# Patient Record
Sex: Female | Born: 1985 | Race: Black or African American | Hispanic: No | Marital: Single | Smoking: Former smoker
Health system: Southern US, Community
[De-identification: ages and names within clinical notes are randomized; demographics above are authoritative.]

## PROBLEM LIST (undated history)

## (undated) DIAGNOSIS — I2699 Other pulmonary embolism without acute cor pulmonale: Secondary | ICD-10-CM

## (undated) DIAGNOSIS — N390 Urinary tract infection, site not specified: Secondary | ICD-10-CM

## (undated) DIAGNOSIS — D649 Anemia, unspecified: Secondary | ICD-10-CM

## (undated) DIAGNOSIS — I82409 Acute embolism and thrombosis of unspecified deep veins of unspecified lower extremity: Secondary | ICD-10-CM

## (undated) DIAGNOSIS — K219 Gastro-esophageal reflux disease without esophagitis: Secondary | ICD-10-CM

## (undated) HISTORY — DX: Gastro-esophageal reflux disease without esophagitis: K21.9

## (undated) HISTORY — DX: Urinary tract infection, site not specified: N39.0

---

## 2015-03-30 ENCOUNTER — Other Ambulatory Visit (HOSPITAL_COMMUNITY): Payer: Self-pay | Admitting: Family Medicine

## 2015-03-30 DIAGNOSIS — M5416 Radiculopathy, lumbar region: Secondary | ICD-10-CM

## 2015-04-16 ENCOUNTER — Ambulatory Visit (HOSPITAL_COMMUNITY): Payer: Managed Care, Other (non HMO)

## 2015-05-28 ENCOUNTER — Emergency Department (HOSPITAL_COMMUNITY)
Admission: EM | Admit: 2015-05-28 | Discharge: 2015-05-29 | Disposition: A | Discharge status: Home / Self Care | Payer: Managed Care, Other (non HMO) | Attending: Emergency Medicine | Admitting: Emergency Medicine

## 2015-05-28 ENCOUNTER — Encounter (HOSPITAL_COMMUNITY): Payer: Self-pay | Admitting: Emergency Medicine

## 2015-05-28 ENCOUNTER — Emergency Department (HOSPITAL_COMMUNITY): Payer: Managed Care, Other (non HMO)

## 2015-05-28 DIAGNOSIS — R0602 Shortness of breath: Secondary | ICD-10-CM | POA: Insufficient documentation

## 2015-05-28 DIAGNOSIS — R5381 Other malaise: Secondary | ICD-10-CM | POA: Insufficient documentation

## 2015-05-28 DIAGNOSIS — Z87891 Personal history of nicotine dependence: Secondary | ICD-10-CM | POA: Insufficient documentation

## 2015-05-28 DIAGNOSIS — K5900 Constipation, unspecified: Secondary | ICD-10-CM | POA: Insufficient documentation

## 2015-05-28 DIAGNOSIS — Z86711 Personal history of pulmonary embolism: Secondary | ICD-10-CM | POA: Insufficient documentation

## 2015-05-28 DIAGNOSIS — R1310 Dysphagia, unspecified: Secondary | ICD-10-CM | POA: Insufficient documentation

## 2015-05-28 DIAGNOSIS — K219 Gastro-esophageal reflux disease without esophagitis: Secondary | ICD-10-CM | POA: Insufficient documentation

## 2015-05-28 DIAGNOSIS — Z3202 Encounter for pregnancy test, result negative: Secondary | ICD-10-CM | POA: Diagnosis not present

## 2015-05-28 DIAGNOSIS — R079 Chest pain, unspecified: Secondary | ICD-10-CM | POA: Diagnosis present

## 2015-05-28 HISTORY — DX: Other pulmonary embolism without acute cor pulmonale: I26.99

## 2015-05-28 LAB — CBC
HEMATOCRIT: 36.4 % (ref 36.0–46.0)
Hemoglobin: 11.9 g/dL — ABNORMAL LOW (ref 12.0–15.0)
MCH: 29.8 pg (ref 26.0–34.0)
MCHC: 32.7 g/dL (ref 30.0–36.0)
MCV: 91.2 fL (ref 78.0–100.0)
Platelets: 225 10*3/uL (ref 150–400)
RBC: 3.99 MIL/uL (ref 3.87–5.11)
RDW: 13.2 % (ref 11.5–15.5)
WBC: 6.8 10*3/uL (ref 4.0–10.5)

## 2015-05-28 LAB — HEPATIC FUNCTION PANEL
ALBUMIN: 3.9 g/dL (ref 3.5–5.0)
ALK PHOS: 48 U/L (ref 38–126)
ALT: 10 U/L — ABNORMAL LOW (ref 14–54)
AST: 14 U/L — ABNORMAL LOW (ref 15–41)
BILIRUBIN TOTAL: 0.4 mg/dL (ref 0.3–1.2)
Bilirubin, Direct: <0.1 mg/dL — ABNORMAL LOW (ref 0.1–0.5)
TOTAL PROTEIN: 7.2 g/dL (ref 6.5–8.1)

## 2015-05-28 LAB — CBC WITH DIFFERENTIAL/PLATELET
BASOS ABS: 0 10*3/uL (ref 0.0–0.1)
Basophils Relative: 1 %
EOS ABS: 0.1 10*3/uL (ref 0.0–0.7)
EOS PCT: 1 %
HCT: 36.5 % (ref 36.0–46.0)
HEMOGLOBIN: 12.1 g/dL (ref 12.0–15.0)
LYMPHS ABS: 3.7 10*3/uL (ref 0.7–4.0)
Lymphocytes Relative: 55 %
MCH: 30.3 pg (ref 26.0–34.0)
MCHC: 33.2 g/dL (ref 30.0–36.0)
MCV: 91.5 fL (ref 78.0–100.0)
Monocytes Absolute: 0.4 10*3/uL (ref 0.1–1.0)
Monocytes Relative: 7 %
NEUTROS PCT: 36 %
Neutro Abs: 2.3 10*3/uL (ref 1.7–7.7)
PLATELETS: 223 10*3/uL (ref 150–400)
RBC: 3.99 MIL/uL (ref 3.87–5.11)
RDW: 13.1 % (ref 11.5–15.5)
WBC: 6.5 10*3/uL (ref 4.0–10.5)

## 2015-05-28 LAB — BASIC METABOLIC PANEL
Anion gap: 9 (ref 5–15)
BUN: 9 mg/dL (ref 6–20)
CHLORIDE: 107 mmol/L (ref 101–111)
CO2: 23 mmol/L (ref 22–32)
Calcium: 8.8 mg/dL — ABNORMAL LOW (ref 8.9–10.3)
Creatinine, Ser: 0.8 mg/dL (ref 0.44–1.00)
GFR calc Af Amer: >60 mL/min (ref >60)
GFR calc non Af Amer: >60 mL/min (ref >60)
GLUCOSE: 83 mg/dL (ref 65–99)
POTASSIUM: 3.7 mmol/L (ref 3.5–5.1)
Sodium: 139 mmol/L (ref 135–145)

## 2015-05-28 LAB — POC URINE PREG, ED: Preg Test, Ur: NEGATIVE

## 2015-05-28 LAB — I-STAT TROPONIN, ED: Troponin i, poc: 0 ng/mL (ref 0.00–0.08)

## 2015-05-28 LAB — LIPASE, BLOOD: Lipase: 52 U/L — ABNORMAL HIGH (ref 11–51)

## 2015-05-28 MED ORDER — SUCRALFATE 1 G PO TABS
1.0000 g | ORAL_TABLET | Freq: Once | ORAL | Status: AC
  Administered 2015-05-28: 1 g via ORAL
  Filled 2015-05-28: qty 1

## 2015-05-28 MED ORDER — SODIUM CHLORIDE 0.9 % IV SOLN: 1000.0000 mL | Freq: Once | INTRAVENOUS | Status: DC

## 2015-05-28 MED ORDER — SODIUM CHLORIDE 0.9 % IV BOLUS (SEPSIS)
1000.0000 mL | Freq: Once | INTRAVENOUS | Status: AC
Start: 2015-05-28 — End: 2015-05-29
  Administered 2015-05-28: 1000 mL via INTRAVENOUS

## 2015-05-28 MED ORDER — GI COCKTAIL ~~LOC~~
30.0000 mL | Freq: Once | ORAL | Status: AC
  Administered 2015-05-28: 30 mL via ORAL
  Filled 2015-05-28: qty 30

## 2015-05-28 MED ORDER — PANTOPRAZOLE SODIUM 40 MG IV SOLR
40.0000 mg | Freq: Once | INTRAVENOUS | Status: AC
  Administered 2015-05-28: 40 mg via INTRAVENOUS
  Filled 2015-05-28: qty 40

## 2015-05-28 MED ORDER — ONDANSETRON HCL 4 MG/2ML IJ SOLN
4.0000 mg | Freq: Once | INTRAMUSCULAR | Status: AC
  Administered 2015-05-28: 4 mg via INTRAVENOUS
  Filled 2015-05-28: qty 2

## 2015-05-28 MED ORDER — SODIUM CHLORIDE 0.9 % IV SOLN: 1000.0000 mL | INTRAVENOUS | Status: DC

## 2015-05-28 NOTE — ED Notes (Signed)
PA at bedside.

## 2015-05-28 NOTE — ED Provider Notes (Signed)
CSN: [id number]     Arrival date & time 05/28/15  1744 History   First MD Initiated Contact with Patient 05/28/15 2141     Chief Complaint  Patient presents with  . Chest Pain     (Consider location/radiation/quality/duration/timing/severity/associated sxs/prior Treatment) HPI   Catherine Torres is a 30 y.o. 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  Associated symptoms include SOB, nausea, diarrhea (resolved), constipation, malaise, and dysphagia.  Denies cough, vomiting, or urinary symptoms.  She reports decreased PO intake secondary to nausea.  No family hx of sudden cardiac death.  She does not smoke.  Denies illicit drug use.  Denies recent changes in diet or medications.  She does have a PCP, but has not seen them for this problem.     Past Medical History  Diagnosis Date  . Pulmonary emboli (HCC)    History reviewed. No pertinent past surgical history. History reviewed. No pertinent family history. Social History  Substance Use Topics  . Smoking status: Former Games developer  . Smokeless tobacco: None  . Alcohol Use: No   OB History    No data available     Review of Systems  All other systems negative unless otherwise stated in HPI   Allergies  Review of patient's allergies indicates no known allergies.  Home Medications   Prior to Admission medications   Medication Sig Start Date End Date Taking? Authorizing Provider  ibuprofen (ADVIL,MOTRIN) 200 MG tablet Take 200 mg by mouth every 6 (six) hours as needed for moderate pain.   Yes Historical Provider, MD  pantoprazole (PROTONIX) 40 MG tablet Take 1 tablet (40 mg total) by mouth daily. 05/29/15   Cheri Fowler, PA-C  sucralfate (CARAFATE) 1 g tablet Take 1 tablet (1 g total) by mouth 4 (four) times daily. 05/29/15   Yemaya Barnier,  PA-C   BP 120/83 mmHg  Pulse 76  Temp(Src) 98 F (36.7 C) (Oral)  Resp 16  Ht 5\' 3"  (1.6 m)  Wt 81.647 kg  BMI 31.89 kg/m2  SpO2 100%  LMP 05/17/2015 Physical Exam  Constitutional: She is oriented to person, place, and time. She appears well-developed and well-nourished.  HENT:  Head: Normocephalic and atraumatic.  Mouth/Throat: Oropharynx is clear and moist.  Eyes: Conjunctivae are normal. Pupils are equal, round, and reactive to light.  Neck: Normal range of motion. Neck supple.  Cardiovascular: Normal rate, regular rhythm and normal heart sounds.   No murmur heard. Pulmonary/Chest: Effort normal and breath sounds normal. No accessory muscle usage or stridor. No respiratory distress. She has no wheezes. She has no rhonchi. She has no rales.  Abdominal: Soft. Bowel sounds are normal. She exhibits no distension. There is tenderness in the epigastric area. There is no rebound and no guarding.  Musculoskeletal: Normal range of motion.  Lymphadenopathy:    She has no cervical adenopathy.  Neurological: She is alert and oriented to person, place, and time.  Speech clear without dysarthria.  Skin: Skin is warm and dry.  Psychiatric: She has a normal mood and affect. Her behavior is normal.    ED Course  Procedures (including critical care time) Labs Review Labs Reviewed  BASIC METABOLIC PANEL - Abnormal; Notable for the following:    Calcium 8.8 (*)    All other components within normal limits  CBC -  Abnormal; Notable for the following:    Hemoglobin 11.9 (*)    All other components within normal limits  LIPASE, BLOOD - Abnormal; Notable for the following:    Lipase 52 (*)    All other components within normal limits  HEPATIC FUNCTION PANEL - Abnormal; Notable for the following:    AST 14 (*)    ALT 10 (*)    Bilirubin, Direct <0.1 (*)    All other components within normal limits  URINE CULTURE  CBC WITH DIFFERENTIAL/PLATELET  CBC WITH DIFFERENTIAL/PLATELET   URINALYSIS, ROUTINE W REFLEX MICROSCOPIC (NOT AT [facility])  Rosezena SensorI-STAT TROPOININ, ED  POC URINE PREG, ED    Imaging Review Dg Chest 2 View  05/28/2015  CLINICAL DATA:  Right-sided chest pain and shortness of breath for 2 weeks. EXAM: CHEST  2 VIEW COMPARISON:  None. FINDINGS: The heart size and mediastinal contours are within normal limits. Both lungs are clear. The visualized skeletal structures are unremarkable. IMPRESSION: Normal chest x-ray. Electronically Signed   By: Rudie MeyerP.  Gallerani M.D.   On: 05/28/2015 18:37   I have personally reviewed and evaluated these images and lab results as part of my medical decision-making.   EKG Interpretation   Date/Time:  Monday May 28 2015 18:06:38 EST Ventricular Rate:  77 PR Interval:  148 QRS Duration: 75 QT Interval:  366 QTC Calculation: 414 R Axis:   73 Text Interpretation:  Sinus rhythm Normal ECG No old tracing to compare  Confirmed by GOLDSTON  MD, SCOTT ([id number]) on 05/28/2015 10:14:29 PM      MDM   Final diagnoses:  Gastroesophageal reflux disease, esophagitis presence not specified    Patient presents with epigastric abdominal pain x 2 weeks with chest pressure and nausea.  Mild relief with pepto bismol and belching.  Made worse with palpation and eating.  No family hx of SCD.  Denies illicit drugs or tobacco use.  On exam, heart RRR, lungs CTAB, abdomen soft with tenderness in the epigastric region.  No rebound or guarding.  HEART score--1.  Initial troponin 0.00, EKG NSR, CXR negative.  No indication for delta troponin given 2w of constant sxs.  VSS.  Doubt cardiac etiology.  Doubt pulmonary etiology.  Suspect GERD.  Will give fluids, gi cocktail, protonix, and carafate.  BMP and CBC unremarkable.  Lipase slightly elevated at 52, but no indication of pancreatitis.  Hepatic fxn panel unremarkable.  UA negative.  Urine pregnancy negative.  Patient symptoms have improved.  Will d/c home with protonix and carafate.  Follow up with PCP.   Discussed return precautions.  Patient agrees and acknowledges the above plan for discharge.  Meds given in ED:  Medications  gi cocktail (Maalox,Lidocaine,Donnatal) (30 mLs Oral Given 05/28/15 2242)  pantoprazole (PROTONIX) injection 40 mg (40 mg Intravenous Given 05/28/15 2253)  ondansetron (ZOFRAN) injection 4 mg (4 mg Intravenous Given 05/28/15 2253)  sucralfate (CARAFATE) tablet 1 g (1 g Oral Given 05/28/15 2242)  sodium chloride 0.9 % bolus 1,000 mL (0 mLs Intravenous Stopped 05/29/15 0009)    New Prescriptions   PANTOPRAZOLE (PROTONIX) 40 MG TABLET    Take 1 tablet (40 mg total) by mouth daily.   SUCRALFATE (CARAFATE) 1 G TABLET    Take 1 tablet (1 g total) by mouth 4 (four) times daily.        Cheri FowlerKayla Hiro Vipond, PA-C 05/29/15 [id number]  Pricilla LovelessScott Goldston, MD 05/30/15 [phone]

## 2015-05-28 NOTE — ED Notes (Signed)
Pt c/o central chest pressure/pain from throat to epigastric region, SOB, nausea, constipation, malaise onset 2 weeks ago.

## 2015-05-29 LAB — URINE MICROSCOPIC-ADD ON

## 2015-05-29 LAB — URINALYSIS, ROUTINE W REFLEX MICROSCOPIC
Bilirubin Urine: NEGATIVE
Glucose, UA: NEGATIVE mg/dL
KETONES UR: NEGATIVE mg/dL
NITRITE: NEGATIVE
PH: 6 (ref 5.0–8.0)
Protein, ur: NEGATIVE mg/dL
SPECIFIC GRAVITY, URINE: 1.024 (ref 1.005–1.030)

## 2015-05-29 MED ORDER — PANTOPRAZOLE SODIUM 40 MG PO TBEC: 40.0000 mg | DELAYED_RELEASE_TABLET | Freq: Every day | ORAL | Status: DC

## 2015-05-29 MED ORDER — SUCRALFATE 1 G PO TABS: 1.0000 g | ORAL_TABLET | Freq: Four times a day (QID) | ORAL | Status: DC

## 2015-05-29 NOTE — Discharge Instructions (Signed)
Gastroesophageal Reflux Disease, Adult °Normally, food travels down the esophagus and stays in the stomach to be digested. However, when a person has gastroesophageal reflux disease (GERD), food and stomach acid move back up into the esophagus. When this happens, the esophagus becomes sore and inflamed. Over time, GERD can create small holes (ulcers) in the lining of the esophagus.  °CAUSES °This condition is caused by a problem with the muscle between the esophagus and the stomach (lower esophageal sphincter, or LES). Normally, the LES muscle closes after food passes through the esophagus to the stomach. When the LES is weakened or abnormal, it does not close properly, and that allows food and stomach acid to go back up into the esophagus. The LES can be weakened by certain dietary substances, medicines, and medical conditions, including: °· Tobacco use. °· Pregnancy. °· Having a hiatal hernia. °· Heavy alcohol use. °· Certain foods and beverages, such as coffee, chocolate, onions, and peppermint. °RISK FACTORS °This condition is more likely to develop in: °· People who have an increased body weight. °· People who have connective tissue disorders. °· People who use NSAID medicines. °SYMPTOMS °Symptoms of this condition include: °· Heartburn. °· Difficult or painful swallowing. °· The feeling of having a lump in the throat. °· A bitter taste in the mouth. °· Bad breath. °· Having a large amount of saliva. °· Having an upset or bloated stomach. °· Belching. °· Chest pain. °· Shortness of breath or wheezing. °· Ongoing (chronic) cough or a night-time cough. °· Wearing away of tooth enamel. °· Weight loss. °Different conditions can cause chest pain. Make sure to see your health care provider if you experience chest pain. °DIAGNOSIS °Your health care provider will take a medical history and perform a physical exam. To determine if you have mild or severe GERD, your health care provider may also monitor how you respond  to treatment. You may also have other tests, including: °· An endoscopy to examine your stomach and esophagus with a small camera. °· A test that measures the acidity level in your esophagus. °· A test that measures how much pressure is on your esophagus. °· A barium swallow or modified barium swallow to show the shape, size, and functioning of your esophagus. °TREATMENT °The goal of treatment is to help relieve your symptoms and to prevent complications. Treatment for this condition may vary depending on how severe your symptoms are. Your health care provider may recommend: °· Changes to your diet. °· Medicine. °· Surgery. °HOME CARE INSTRUCTIONS °Diet °· Follow a diet as recommended by your health care provider. This may involve avoiding foods and drinks such as: °¨ Coffee and tea (with or without caffeine). °¨ Drinks that contain alcohol. °¨ Energy drinks and sports drinks. °¨ Carbonated drinks or sodas. °¨ Chocolate and cocoa. °¨ Peppermint and mint flavorings. °¨ Garlic and onions. °¨ Horseradish. °¨ Spicy and acidic foods, including peppers, chili powder, curry powder, vinegar, hot sauces, and barbecue sauce. °¨ Citrus fruit juices and citrus fruits, such as oranges, lemons, and limes. °¨ Tomato-based foods, such as red sauce, chili, salsa, and pizza with red sauce. °¨ Fried and fatty foods, such as donuts, french fries, potato chips, and high-fat dressings. °¨ High-fat meats, such as hot dogs and fatty cuts of red and white meats, such as rib eye steak, sausage, ham, and bacon. °¨ High-fat dairy items, such as whole milk, butter, and cream cheese. °· Eat small, frequent meals instead of large meals. °· Avoid drinking large amounts of liquid with your   meals. °· Avoid eating meals during the 2-3 hours before bedtime. °· Avoid lying down right after you eat. °· Do not exercise right after you eat. ° General Instructions  °· Pay attention to any changes in your symptoms. °· Take over-the-counter and prescription  medicines only as told by your health care provider. Do not take aspirin, ibuprofen, or other NSAIDs unless your health care provider told you to do so. °· Do not use any tobacco products, including cigarettes, chewing tobacco, and e-cigarettes. If you need help quitting, ask your health care provider. °· Wear loose-fitting clothing. Do not wear anything tight around your waist that causes pressure on your abdomen. °· Raise (elevate) the head of your bed 6 inches (15cm). °· Try to reduce your stress, such as with yoga or meditation. If you need help reducing stress, ask your health care provider. °· If you are overweight, reduce your weight to an amount that is healthy for you. Ask your health care provider for guidance about a safe weight loss goal. °· Keep all follow-up visits as told by your health care provider. This is important. °SEEK MEDICAL CARE IF: °· You have new symptoms. °· You have unexplained weight loss. °· You have difficulty swallowing, or it hurts to swallow. °· You have wheezing or a persistent cough. °· Your symptoms do not improve with treatment. °· You have a hoarse voice. °SEEK IMMEDIATE MEDICAL CARE IF: °· You have pain in your arms, neck, jaw, teeth, or back. °· You feel sweaty, dizzy, or light-headed. °· You have chest pain or shortness of breath. °· You vomit and your vomit looks like blood or coffee grounds. °· You faint. °· Your stool is bloody or black. °· You cannot swallow, drink, or eat. °  °This information is not intended to replace advice given to you by your health care provider. Make sure you discuss any questions you have with your health care provider. °  °Document Released: 02/05/2005 Document Revised: 01/17/2015 Document Reviewed: 08/23/2014 °Elsevier Interactive Patient Education ©2016 [employer] ° °Food Choices for Gastroesophageal Reflux Disease, Adult °When you have gastroesophageal reflux disease (GERD), the foods you eat and your eating habits are very important.  Choosing the right foods can help ease your discomfort.  °WHAT GUIDELINES DO I NEED TO FOLLOW?  °· Choose fruits, vegetables, whole grains, and low-fat dairy products.   °· Choose low-fat meat, fish, and poultry. °· Limit fats such as oils, salad dressings, butter, nuts, and avocado.   °· Keep a food diary. This helps you identify foods that cause symptoms.   °· Avoid foods that cause symptoms. These may be different for everyone.   °· Eat small meals often instead of 3 large meals a day.   °· Eat your meals slowly, in a place where you are relaxed.   °· Limit fried foods.   °· Cook foods using methods other than frying.   °· Avoid drinking alcohol.   °· Avoid drinking large amounts of liquids with your meals.   °· Avoid bending over or lying down until 2-3 hours after eating.   °WHAT FOODS ARE NOT RECOMMENDED?  °These are some foods and drinks that may make your symptoms worse: °Vegetables °Tomatoes. Tomato juice. Tomato and spaghetti sauce. Chili peppers. Onion and garlic. Horseradish. °Fruits °Oranges, grapefruit, and lemon (fruit and juice). °Meats °High-fat meats, fish, and poultry. This includes hot dogs, ribs, ham, sausage, salami, and bacon. °Dairy °Whole milk and chocolate milk. Sour cream. Cream. Butter. Ice cream. Cream cheese.  °Drinks °Coffee and tea. Bubbly (carbonated) drinks or energy   drinks. °Condiments °Hot sauce. Barbecue sauce.  °Sweets/Desserts °Chocolate and cocoa. Donuts. Peppermint and spearmint. °Fats and Oils °High-fat foods. This includes French fries and potato chips. °Other °Vinegar. Strong spices. This includes black pepper, white pepper, red pepper, cayenne, curry powder, cloves, ginger, and chili powder. °The items listed above may not be a complete list of foods and drinks to avoid. Contact your dietitian for more information. °  °This information is not intended to replace advice given to you by your health care provider. Make sure you discuss any questions you have with your health  care provider. °  °Document Released: 10/28/2011 Document Revised: 05/19/2014 Document Reviewed: 03/02/2013 °Elsevier Interactive Patient Education ©2016 [employer] Inc[employer] ° °

## 2015-05-30 LAB — URINE CULTURE

## 2018-04-13 ENCOUNTER — Ambulatory Visit: Payer: Self-pay | Admitting: Family Medicine

## 2018-06-09 ENCOUNTER — Emergency Department (HOSPITAL_COMMUNITY)
Admission: EM | Admit: 2018-06-09 | Discharge: 2018-06-09 | Disposition: A | Discharge status: Home / Self Care | Payer: Medicaid Other | Attending: Emergency Medicine | Admitting: Emergency Medicine

## 2018-06-09 ENCOUNTER — Other Ambulatory Visit: Payer: Self-pay

## 2018-06-09 ENCOUNTER — Emergency Department (HOSPITAL_COMMUNITY): Payer: Medicaid Other

## 2018-06-09 ENCOUNTER — Encounter (HOSPITAL_COMMUNITY): Payer: Self-pay | Admitting: Emergency Medicine

## 2018-06-09 DIAGNOSIS — R079 Chest pain, unspecified: Secondary | ICD-10-CM | POA: Insufficient documentation

## 2018-06-09 DIAGNOSIS — Z87891 Personal history of nicotine dependence: Secondary | ICD-10-CM | POA: Insufficient documentation

## 2018-06-09 LAB — CBC
HCT: 38.9 % (ref 36.0–46.0)
Hemoglobin: 12.4 g/dL (ref 12.0–15.0)
MCH: 30.5 pg (ref 26.0–34.0)
MCHC: 31.9 g/dL (ref 30.0–36.0)
MCV: 95.6 fL (ref 80.0–100.0)
Platelets: 246 10*3/uL (ref 150–400)
RBC: 4.07 MIL/uL (ref 3.87–5.11)
RDW: 13.4 % (ref 11.5–15.5)
WBC: 5 10*3/uL (ref 4.0–10.5)
nRBC: 0 % (ref 0.0–0.2)

## 2018-06-09 LAB — BASIC METABOLIC PANEL
Anion gap: 5 (ref 5–15)
BUN: 11 mg/dL (ref 6–20)
CHLORIDE: 109 mmol/L (ref 98–111)
CO2: 24 mmol/L (ref 22–32)
Calcium: 8.9 mg/dL (ref 8.9–10.3)
Creatinine, Ser: 0.78 mg/dL (ref 0.44–1.00)
GFR calc Af Amer: >60 mL/min (ref >60)
GFR calc non Af Amer: >60 mL/min (ref >60)
Glucose, Bld: 100 mg/dL — ABNORMAL HIGH (ref 70–99)
Potassium: 4 mmol/L (ref 3.5–5.1)
SODIUM: 138 mmol/L (ref 135–145)

## 2018-06-09 LAB — TROPONIN I: Troponin I: <0.03 ng/mL (ref <0.03)

## 2018-06-09 LAB — I-STAT BETA HCG BLOOD, ED (MC, WL, AP ONLY): I-stat hCG, quantitative: <5 m[IU]/mL (ref <5)

## 2018-06-09 MED ORDER — IOPAMIDOL (ISOVUE-370) INJECTION 76%
INTRAVENOUS | Status: AC
  Filled 2018-06-09: qty 100

## 2018-06-09 MED ORDER — SODIUM CHLORIDE (PF) 0.9 % IJ SOLN
INTRAMUSCULAR | Status: AC
  Filled 2018-06-09: qty 50

## 2018-06-09 MED ORDER — KETOROLAC TROMETHAMINE 15 MG/ML IJ SOLN
15.0000 mg | Freq: Once | INTRAMUSCULAR | Status: AC
  Administered 2018-06-09: 15 mg via INTRAVENOUS
  Filled 2018-06-09: qty 1

## 2018-06-09 MED ORDER — NAPROXEN 500 MG PO TABS: 500.0000 mg | ORAL_TABLET | Freq: Two times a day (BID) | ORAL | 0 refills | Status: AC

## 2018-06-09 MED ORDER — IOPAMIDOL (ISOVUE-370) INJECTION 76%
100.0000 mL | Freq: Once | INTRAVENOUS | Status: AC | PRN
  Administered 2018-06-09: 100 mL via INTRAVENOUS

## 2018-06-09 NOTE — ED Triage Notes (Signed)
Pt c/o rigth chest pains that radiates to back that started at 4am today. Reports SOB, dizziness and nausea with pains. 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

## 2018-06-09 NOTE — ED Provider Notes (Signed)
[facility] Emergency Department Provider Note MRN:  [id number]  Arrival date & time: 06/09/18     Chief Complaint   Chest Pain   History of Present Illness   Catherine Torres is a 33 y.o. year-old female with a history of pulmonary embolism presenting to the ED with chief complaint of chest pain.  Patient was awoken from sleep at 4 AM this morning with sudden onset right-sided sharp chest pain radiating to the back.  Pain seems to be worse with deep breathing, worse with motion, associated with dizziness, shortness of breath.  Patient has a history of pulmonary embolism, but stopped taking her Xarelto few years ago.  Denies headache or vision change, no abdominal pain, no dysuria.  Review of Systems  A complete 10 system review of systems was obtained and all systems are negative except as noted in the HPI and PMH.   Patient's Health History    Past Medical History:  Diagnosis Date  . Pulmonary emboli (HCC)     History reviewed. No pertinent surgical history.  No family history on file.  Social History   Socioeconomic History  . Marital status: Single    Spouse name: Not on file  . Number of children: Not on file  . Years of education: Not on file  . Highest education level: Not on file  Occupational History  . Not on file  Social Needs  . Financial resource strain: Not on file  . Food insecurity:    Worry: Not on file    Inability: Not on file  . Transportation needs:    Medical: Not on file    Non-medical: Not on file  Tobacco Use  . Smoking status: Former Games developer  . Smokeless tobacco: Never Used  Substance and Sexual Activity  . Alcohol use: No  . Drug use: No  . Sexual activity: Not on file  Lifestyle  . Physical activity:    Days per week: Not on file    Minutes per session: Not on file  . Stress: Not on file  Relationships  . Social connections:    Talks on phone: Not on file    Gets together: Not on file    Attends religious service:  Not on file    Active member of club or organization: Not on file    Attends meetings of clubs or organizations: Not on file    Relationship status: Not on file  . Intimate partner violence:    Fear of current or ex partner: Not on file    Emotionally abused: Not on file    Physically abused: Not on file    Forced sexual activity: Not on file  Other Topics Concern  . Not on file  Social History Narrative  . Not on file     Physical Exam  Vital Signs and Nursing Notes reviewed Vitals:   06/09/18 0930 06/09/18 1018  BP: 119/84 119/84  Pulse:  88  Resp: 19 18  Temp:    SpO2:  100%    CONSTITUTIONAL: Well-appearing, NAD NEURO:  Alert and oriented x 3, no focal deficits EYES:  eyes equal and reactive ENT/NECK:  no LAD, no JVD CARDIO: Regular rate, well-perfused, normal S1 and S2; tenderness palpation to the right central chest PULM:  CTAB no wheezing or rhonchi GI/GU:  normal bowel sounds, non-distended, non-tender MSK/SPINE:  No gross deformities, no edema SKIN:  no rash, atraumatic PSYCH:  Appropriate speech and behavior  Diagnostic and Interventional Summary  EKG Interpretation  Date/Time:  Wednesday June 09 2018 07:27:47 EST Ventricular Rate:  97 PR Interval:    QRS Duration: 76 QT Interval:  328 QTC Calculation: 417 R Axis:   83 Text Interpretation:  Sinus rhythm Borderline T wave abnormalities Confirmed by Kennis CarinaBero, Michael [phone]) on 06/09/2018 8:33:46 AM Also confirmed by Kennis CarinaBero, Michael [phone]), editor Barbette Hairassel, Kerry [phone])  on 06/09/2018 10:25:42 AM      Labs Reviewed  BASIC METABOLIC PANEL - Abnormal; Notable for the following components:      Result Value   Glucose, Bld 100 (*)    All other components within normal limits  CBC  TROPONIN I  I-STAT BETA HCG BLOOD, ED ([facility], [facility], [facility] ONLY)    CT ANGIO CHEST PE W OR WO CONTRAST  Final Result    DG Chest 2 View  Final Result      Medications  sodium chloride (PF) 0.9 % injection (has no administration in  time range)  iopamidol (ISOVUE-370) 76 % injection (has no administration in time range)  ketorolac (TORADOL) 15 MG/ML injection 15 mg (15 mg Intravenous Given 06/09/18 0844)  iopamidol (ISOVUE-370) 76 % injection 100 mL (100 mLs Intravenous Contrast Given 06/09/18 0959)     Procedures Critical Care  ED Course and Medical Decision Making  I have reviewed the triage vital signs and the nursing notes.  Pertinent labs & imaging results that were available during my care of the patient were reviewed by me and considered in my medical decision making (see below for details).  Moderate to high risk for pulmonary embolism given her past medical history and medication noncompliance.  CT pending.  CT negative, EKG with no concerns, troponin negative.  Given the reproducibility on exam, the fact that the pain is worse with motion, favoring muscular etiology.  Provided reassurance, prescription for Naprosyn.  Advised to restart her blood thinner.  After the discussed management above, the patient was determined to be safe for discharge.  The patient was in agreement with this plan and all questions regarding their care were answered.  ED return precautions were discussed and the patient will return to the ED with any significant worsening of condition.  Elmer SowMichael M. Pilar PlateBero, MD [facility] [email or web address]  Final Clinical Impressions(s) / ED Diagnoses     ICD-10-CM   1. Chest pain, unspecified type R07.9     ED Discharge Orders         Ordered    naproxen (NAPROSYN) 500 MG tablet  2 times daily     06/09/18 1055             Sabas SousBero, Michael M, MD 06/09/18 1057

## 2018-06-09 NOTE — Discharge Instructions (Signed)
You were evaluated in the Emergency Department and after careful evaluation, we did not find any emergent condition requiring admission or further testing in the hospital.  Your symptoms today seem to be due to costochondritis, or pain/inflammation of the joints between the ribs.  Use the anti-inflammatory medication provided as directed.  We recommend that you restart your blood thinner.  Please return to the Emergency Department if you experience any worsening of your condition.  We encourage you to follow up with a primary care provider.  Thank you for allowing [place] to be a part of your care.

## 2018-12-08 DIAGNOSIS — Z113 Encounter for screening for infections with a predominantly sexual mode of transmission: Secondary | ICD-10-CM | POA: Diagnosis not present

## 2018-12-08 DIAGNOSIS — R102 Pelvic and perineal pain: Secondary | ICD-10-CM | POA: Diagnosis not present

## 2018-12-08 DIAGNOSIS — R1032 Left lower quadrant pain: Secondary | ICD-10-CM | POA: Diagnosis not present

## 2019-01-07 DIAGNOSIS — Z01419 Encounter for gynecological examination (general) (routine) without abnormal findings: Secondary | ICD-10-CM | POA: Diagnosis not present

## 2019-01-07 DIAGNOSIS — Z124 Encounter for screening for malignant neoplasm of cervix: Secondary | ICD-10-CM | POA: Diagnosis not present

## 2019-01-07 DIAGNOSIS — Z6830 Body mass index (BMI) 30.0-30.9, adult: Secondary | ICD-10-CM | POA: Diagnosis not present

## 2019-01-07 DIAGNOSIS — Z1151 Encounter for screening for human papillomavirus (HPV): Secondary | ICD-10-CM | POA: Diagnosis not present

## 2019-02-10 DIAGNOSIS — M79641 Pain in right hand: Secondary | ICD-10-CM | POA: Diagnosis not present

## 2019-02-10 DIAGNOSIS — Z7689 Persons encountering health services in other specified circumstances: Secondary | ICD-10-CM | POA: Diagnosis not present

## 2019-02-10 DIAGNOSIS — M7989 Other specified soft tissue disorders: Secondary | ICD-10-CM | POA: Diagnosis not present

## 2019-03-10 DIAGNOSIS — R002 Palpitations: Secondary | ICD-10-CM | POA: Diagnosis not present

## 2019-03-10 DIAGNOSIS — K5900 Constipation, unspecified: Secondary | ICD-10-CM | POA: Diagnosis not present

## 2019-07-14 DIAGNOSIS — E559 Vitamin D deficiency, unspecified: Secondary | ICD-10-CM | POA: Diagnosis not present

## 2019-07-14 DIAGNOSIS — R5383 Other fatigue: Secondary | ICD-10-CM | POA: Diagnosis not present

## 2019-08-09 DIAGNOSIS — L918 Other hypertrophic disorders of the skin: Secondary | ICD-10-CM | POA: Diagnosis not present

## 2019-08-10 DIAGNOSIS — D1723 Benign lipomatous neoplasm of skin and subcutaneous tissue of right leg: Secondary | ICD-10-CM | POA: Diagnosis not present

## 2019-10-03 ENCOUNTER — Ambulatory Visit: Payer: Medicaid Other

## 2019-10-05 DIAGNOSIS — N898 Other specified noninflammatory disorders of vagina: Secondary | ICD-10-CM | POA: Diagnosis not present

## 2019-10-05 DIAGNOSIS — Z118 Encounter for screening for other infectious and parasitic diseases: Secondary | ICD-10-CM | POA: Diagnosis not present

## 2019-11-04 ENCOUNTER — Encounter (HOSPITAL_COMMUNITY): Payer: Self-pay

## 2019-11-04 ENCOUNTER — Ambulatory Visit (HOSPITAL_COMMUNITY)
Admission: EM | Admit: 2019-11-04 | Discharge: 2019-11-04 | Disposition: A | Discharge status: Home / Self Care | Payer: BLUE CROSS/BLUE SHIELD | Attending: Emergency Medicine | Admitting: Emergency Medicine

## 2019-11-04 ENCOUNTER — Other Ambulatory Visit: Payer: Self-pay

## 2019-11-04 DIAGNOSIS — R300 Dysuria: Secondary | ICD-10-CM

## 2019-11-04 DIAGNOSIS — N3001 Acute cystitis with hematuria: Secondary | ICD-10-CM | POA: Insufficient documentation

## 2019-11-04 DIAGNOSIS — Z3202 Encounter for pregnancy test, result negative: Secondary | ICD-10-CM

## 2019-11-04 LAB — POCT URINALYSIS DIP (DEVICE)
Bilirubin Urine: NEGATIVE
Glucose, UA: NEGATIVE mg/dL
Ketones, ur: NEGATIVE mg/dL
Nitrite: POSITIVE — AB
Protein, ur: NEGATIVE mg/dL
Specific Gravity, Urine: 1.025 (ref 1.005–1.030)
Urobilinogen, UA: 0.2 mg/dL (ref 0.0–1.0)
pH: 7 (ref 5.0–8.0)

## 2019-11-04 LAB — POC URINE PREG, ED: Preg Test, Ur: NEGATIVE

## 2019-11-04 MED ORDER — NITROFURANTOIN MONOHYD MACRO 100 MG PO CAPS: 100.0000 mg | ORAL_CAPSULE | Freq: Two times a day (BID) | ORAL | 0 refills | Status: AC

## 2019-11-04 MED ORDER — FLUCONAZOLE 150 MG PO TABS: 150.0000 mg | ORAL_TABLET | Freq: Every day | ORAL | 0 refills | Status: DC

## 2019-11-04 NOTE — ED Triage Notes (Signed)
Pt reports having foul smelling and cloudy urine for 2 weeks and a half. Pt is using a vaginal gel, prescribed by her OB, pt do not remember the name.

## 2019-11-04 NOTE — ED Provider Notes (Signed)
[facility]    CSN: [id number] Arrival date & time: 11/04/19  1819      History   Chief Complaint Chief Complaint  Patient presents with  . Dysuria    HPI Catherine Torres is a 34 y.o. female with history of pulmonary emboli presenting for 2-week course of malodorous and cloudy urine.  Denies pelvic, abdominal, back pain, fever.  States she is using a vaginal gel prescribed by her gynecologist, though does not member the name.  States has not been helping with this.  No pruritus, discharge, concern for STI.  Denies frequency, urgency, dysuria.  No renal calculi or pyelonephritis history.   Past Medical History:  Diagnosis Date  . Pulmonary emboli (HCC)     There are no problems to display for this patient.   History reviewed. No pertinent surgical history.  OB History   No obstetric history on file.      Home Medications    Prior to Admission medications   Medication Sig Start Date End Date Taking? Authorizing Provider  ciprofloxacin (CIPRO) 250 MG tablet Take 250 mg by mouth 2 (two) times daily. For 5 days, starting 05/27/2018 05/27/18   [provider]  fluconazole (DIFLUCAN) 150 MG tablet Take 1 tablet (150 mg total) by mouth daily. May repeat in 72 hours if needed 11/04/19   Hall-Potvin, [place], PA-C  ibuprofen (ADVIL,MOTRIN) 200 MG tablet Take 800 mg by mouth every 6 (six) hours as needed for moderate pain.     [provider]  meloxicam (MOBIC) 15 MG tablet Take 15 mg by mouth daily as needed for pain.    [provider]  Multiple Vitamin (MULTIVITAMIN WITH MINERALS) TABS tablet Take 1 tablet by mouth daily.    [provider]  naproxen (NAPROSYN) 500 MG tablet Take 1 tablet (500 mg total) by mouth 2 (two) times daily. 06/09/18   Sabas Sous, MD  nitrofurantoin, macrocrystal-monohydrate, (MACROBID) 100 MG capsule Take 1 capsule (100 mg total) by mouth 2 (two) times daily for 5 days. 11/04/19 11/09/19  Hall-Potvin, [place],  PA-C  pantoprazole (PROTONIX) 40 MG tablet Take 1 tablet (40 mg total) by mouth daily. Patient not taking: Reported on 06/09/2018 05/29/15   Cheri Fowler, PA-C  sucralfate (CARAFATE) 1 g tablet Take 1 tablet (1 g total) by mouth 4 (four) times daily. Patient not taking: Reported on 06/09/2018 05/29/15   Cheri Fowler, PA-C    Family History History reviewed. No pertinent family history.  Social History Social History   Tobacco Use  . Smoking status: Former Games developer  . Smokeless tobacco: Never Used  Vaping Use  . Vaping Use: Never used  Substance Use Topics  . Alcohol use: No  . Drug use: No     Allergies   Penicillins and Latex   Review of Systems As per HPI   Physical Exam Triage Vital Signs ED Triage Vitals  Enc Vitals Group     BP 11/04/19 1837 114/78     Pulse Rate 11/04/19 1837 90     Resp 11/04/19 1837 16     Temp 11/04/19 1837 98.8 F (37.1 C)     Temp Source 11/04/19 1837 Oral     SpO2 11/04/19 1837 97 %     Weight --      Height --      Head Circumference --      Peak Flow --      Pain Score 11/04/19 1836 0     Pain Loc --  Pain Edu? --      Excl. in [place]? --    No data found.  Updated Vital Signs BP 114/78 (BP Location: Right Arm)   Pulse 90   Temp 98.8 F (37.1 C) (Oral)   Resp 16   LMP 09/19/2019 (Exact Date)   SpO2 97%   Visual Acuity Right Eye Distance:   Left Eye Distance:   Bilateral Distance:    Right Eye Near:   Left Eye Near:    Bilateral Near:     Physical Exam Constitutional:      General: She is not in acute distress. HENT:     Head: Normocephalic and atraumatic.  Eyes:     General: No scleral icterus.    Pupils: Pupils are equal, round, and reactive to light.  Cardiovascular:     Rate and Rhythm: Normal rate.  Pulmonary:     Effort: Pulmonary effort is normal.  Abdominal:     General: Bowel sounds are normal.     Palpations: Abdomen is soft.     Tenderness: There is no abdominal tenderness. There is no right CVA  tenderness, left CVA tenderness or guarding.  Skin:    Coloration: Skin is not jaundiced or pale.  Neurological:     Mental Status: She is alert and oriented to person, place, and time.      UC Treatments / Results  Labs (all labs ordered are listed, but only abnormal results are displayed) Labs Reviewed  POCT URINALYSIS DIP (DEVICE) - Abnormal; Notable for the following components:      Result Value   Hgb urine dipstick MODERATE (*)    Nitrite POSITIVE (*)    Leukocytes,Ua SMALL (*)    All other components within normal limits  URINE CULTURE  POC URINE PREG, ED    EKG   Radiology No results found.  Procedures Procedures (including critical care time)  Medications Ordered in UC Medications - No data to display  Initial Impression / Assessment and Plan / UC Course  I have reviewed the triage vital signs and the nursing notes.  Pertinent labs & imaging results that were available during my care of the patient were reviewed by me and considered in my medical decision making (see chart for details).     Patient appears well in office today.  Urine dipstick significant for small leukocytes, positive nitrates, moderate hemoglobin: Culture pending.  Will cover for UTI with Macrobid.  Urine pregnancy negative.  Return precautions discussed, patient verbalized understanding and is agreeable to plan. Final Clinical Impressions(s) / UC Diagnoses   Final diagnoses:  Acute cystitis with hematuria     Discharge Instructions     Take antibiotic twice daily with food. Important to drink plenty of water throughout the day. Return for worsening urinary symptoms, blood in urine, abdominal or back pain, fever.    ED Prescriptions    Medication Sig Dispense Auth. Provider   nitrofurantoin, macrocrystal-monohydrate, (MACROBID) 100 MG capsule Take 1 capsule (100 mg total) by mouth 2 (two) times daily for 5 days. 10 capsule Hall-Potvin, [place], PA-C   fluconazole (DIFLUCAN) 150  MG tablet Take 1 tablet (150 mg total) by mouth daily. May repeat in 72 hours if needed 2 tablet Hall-Potvin, [place], PA-C     PDMP not reviewed this encounter.   Neldon Mc [place], [place] 11/04/19 1929

## 2019-11-04 NOTE — Discharge Instructions (Signed)
Take antibiotic twice daily with food. Important to drink plenty of water throughout the day. Return for worsening urinary symptoms, blood in urine, abdominal or back pain, fever. 

## 2019-11-06 LAB — URINE CULTURE: Culture: >=100000 — AB

## 2019-12-09 ENCOUNTER — Other Ambulatory Visit: Payer: Self-pay

## 2019-12-09 ENCOUNTER — Encounter (HOSPITAL_COMMUNITY): Payer: Self-pay

## 2019-12-09 ENCOUNTER — Ambulatory Visit (HOSPITAL_COMMUNITY)
Admission: EM | Admit: 2019-12-09 | Discharge: 2019-12-09 | Disposition: A | Discharge status: Home / Self Care | Payer: BC Managed Care – PPO | Attending: Urgent Care | Admitting: Urgent Care

## 2019-12-09 DIAGNOSIS — K0889 Other specified disorders of teeth and supporting structures: Secondary | ICD-10-CM

## 2019-12-09 DIAGNOSIS — K047 Periapical abscess without sinus: Secondary | ICD-10-CM

## 2019-12-09 MED ORDER — FLUCONAZOLE 150 MG PO TABS: 150.0000 mg | ORAL_TABLET | ORAL | 0 refills | Status: DC

## 2019-12-09 MED ORDER — CLINDAMYCIN HCL 300 MG PO CAPS: 300.0000 mg | ORAL_CAPSULE | Freq: Three times a day (TID) | ORAL | 0 refills | Status: DC

## 2019-12-09 MED ORDER — TRAMADOL HCL 50 MG PO TABS: 50.0000 mg | ORAL_TABLET | Freq: Four times a day (QID) | ORAL | 0 refills | Status: DC | PRN

## 2019-12-09 NOTE — Discharge Instructions (Addendum)
Please schedule Tylenol at 500 mg - 650 mg once every 6 hours as needed for aches and pains.  If you still have pain despite taking Tylenol regularly, this is breakthrough pain.  You can use tramadol once every 6 hours for this.  Once your pain is better controlled, switch back to just Tylenol. Continue taking naproxen twice daily with food.    [facility] [phone] extension [phone]  Dr. Lawrence Marseilles [phone] [address]  [place] [phone] [address] [facility] [place].  [facility] [phone] extension [phone], [place], [place], [address] First come first serve for the first 10 clients.  May do simple extractions only, no wisdom teeth or surgery.  You may try the second for Thursday of the month starting at 6:30 AM.  [facility] You may call the school to see if they are still helping to provide dental care for emergent cases.

## 2019-12-09 NOTE — ED Provider Notes (Addendum)
[facility]   MRN: [id number] DOB: 22-Oct-1985  Subjective:   Catherine Torres is a 34 y.o. female presenting for 2-day history of acute onset worsening severe left-sided dental pain, difficulty opening her mouth, difficulty chewing.  She does not have a dentist.  Has previously had to have a lot of dental work done.  Denies fever, chest pain, shortness of breath, cough.  No current facility-administered medications for this encounter.  Current Outpatient Medications:  .  ciprofloxacin (CIPRO) 250 MG tablet, Take 250 mg by mouth 2 (two) times daily. For 5 days, starting 05/27/2018, Disp: , Rfl:  .  fluconazole (DIFLUCAN) 150 MG tablet, Take 1 tablet (150 mg total) by mouth daily. May repeat in 72 hours if needed, Disp: 2 tablet, Rfl: 0 .  ibuprofen (ADVIL,MOTRIN) 200 MG tablet, Take 800 mg by mouth every 6 (six) hours as needed for moderate pain. , Disp: , Rfl:  .  meloxicam (MOBIC) 15 MG tablet, Take 15 mg by mouth daily as needed for pain., Disp: , Rfl:  .  Multiple Vitamin (MULTIVITAMIN WITH MINERALS) TABS tablet, Take 1 tablet by mouth daily., Disp: , Rfl:  .  naproxen (NAPROSYN) 500 MG tablet, Take 1 tablet (500 mg total) by mouth 2 (two) times daily., Disp: 30 tablet, Rfl: 0 .  pantoprazole (PROTONIX) 40 MG tablet, Take 1 tablet (40 mg total) by mouth daily. (Patient not taking: Reported on 06/09/2018), Disp: 30 tablet, Rfl: 0 .  sucralfate (CARAFATE) 1 g tablet, Take 1 tablet (1 g total) by mouth 4 (four) times daily. (Patient not taking: Reported on 06/09/2018), Disp: 60 tablet, Rfl: 0   Allergies  Allergen Reactions  . Penicillins     Did it involve swelling of the face/tongue/throat, SOB, or low BP? yes Did it involve sudden or severe rash/hives, skin peeling, or any reaction on the inside of your mouth or nose? yes Did you need to seek medical attention at a hospital or doctor's office? yes When did it last happen?2019 If all above answers are "NO", may proceed with  cephalosporin use.  . Latex Itching and Rash    Past Medical History:  Diagnosis Date  . Pulmonary emboli (HCC)      History reviewed. No pertinent surgical history.  Family History  Family history unknown: Yes    Social History   Tobacco Use  . Smoking status: Former Games developer  . Smokeless tobacco: Never Used  Vaping Use  . Vaping Use: Never used  Substance Use Topics  . Alcohol use: No  . Drug use: No    ROS   Objective:   Vitals: BP 124/78 (BP Location: Right Arm)   Pulse 65   Temp 98 F (36.7 C) (Oral)   Resp 18   LMP 11/30/2019   SpO2 99%   Physical Exam Constitutional:      General: She is not in acute distress.    Appearance: Normal appearance. She is well-developed. She is not ill-appearing, toxic-appearing or diaphoretic.  HENT:     Head: Normocephalic and atraumatic.      Nose: Nose normal.     Mouth/Throat:     Mouth: Mucous membranes are moist.     Pharynx: Oropharynx is clear.   Eyes:     General: No scleral icterus.    Extraocular Movements: Extraocular movements intact.     Pupils: Pupils are equal, round, and reactive to light.  Cardiovascular:     Rate and Rhythm: Normal rate.  Pulmonary:  Effort: Pulmonary effort is normal.  Skin:    General: Skin is warm and dry.  Neurological:     General: No focal deficit present.     Mental Status: She is alert and oriented to person, place, and time.  Psychiatric:        Mood and Affect: Mood normal.        Behavior: Behavior normal.       Assessment and Plan :   I have reviewed the PDMP during this encounter.  1. Pain, dental   2. Dental infection   3. Dental abscess     Start clindamycin for dental infection/abscess, use naproxen for pain and inflammation, tramadol for breakthrough pain. Emphasized need for dental surgeon consult. Counseled patient on potential for adverse effects with medications prescribed/recommended today, strict ER and return-to-clinic precautions  discussed, patient verbalized understanding.    Wallis Bamberg, [place] 12/09/19 [phone]

## 2019-12-09 NOTE — ED Triage Notes (Signed)
Pt presents with left side dental pain since yesterday.  

## 2020-01-24 ENCOUNTER — Other Ambulatory Visit: Payer: Self-pay

## 2020-01-24 ENCOUNTER — Other Ambulatory Visit: Payer: BC Managed Care – PPO

## 2020-01-24 DIAGNOSIS — Z20822 Contact with and (suspected) exposure to covid-19: Secondary | ICD-10-CM

## 2020-01-26 ENCOUNTER — Telehealth: Payer: Self-pay

## 2020-01-26 LAB — SARS-COV-2, NAA 2 DAY TAT

## 2020-01-26 LAB — NOVEL CORONAVIRUS, NAA: SARS-CoV-2, NAA: NOT DETECTED

## 2020-01-26 NOTE — Telephone Encounter (Signed)
Pt. Checking on COVID 19 results, not available yet. °

## 2020-03-25 ENCOUNTER — Emergency Department (HOSPITAL_COMMUNITY)
Admission: EM | Admit: 2020-03-25 | Discharge: 2020-03-26 | Disposition: A | Discharge status: Home / Self Care | Payer: Medicaid Other | Attending: Emergency Medicine | Admitting: Emergency Medicine

## 2020-03-25 ENCOUNTER — Encounter (HOSPITAL_COMMUNITY): Payer: Self-pay | Admitting: Emergency Medicine

## 2020-03-25 ENCOUNTER — Other Ambulatory Visit: Payer: Self-pay

## 2020-03-25 DIAGNOSIS — Z91040 Latex allergy status: Secondary | ICD-10-CM | POA: Insufficient documentation

## 2020-03-25 DIAGNOSIS — R55 Syncope and collapse: Secondary | ICD-10-CM | POA: Insufficient documentation

## 2020-03-25 DIAGNOSIS — Z87891 Personal history of nicotine dependence: Secondary | ICD-10-CM | POA: Diagnosis not present

## 2020-03-25 LAB — BASIC METABOLIC PANEL
Anion gap: 9 (ref 5–15)
BUN: 6 mg/dL (ref 6–20)
CO2: 22 mmol/L (ref 22–32)
Calcium: 9.1 mg/dL (ref 8.9–10.3)
Chloride: 104 mmol/L (ref 98–111)
Creatinine, Ser: 0.53 mg/dL (ref 0.44–1.00)
GFR, Estimated: >60 mL/min (ref >60)
Glucose, Bld: 100 mg/dL — ABNORMAL HIGH (ref 70–99)
Potassium: 4.2 mmol/L (ref 3.5–5.1)
Sodium: 135 mmol/L (ref 135–145)

## 2020-03-25 LAB — URINALYSIS, ROUTINE W REFLEX MICROSCOPIC
Bilirubin Urine: NEGATIVE
Glucose, UA: NEGATIVE mg/dL
Ketones, ur: 5 mg/dL — AB
Nitrite: NEGATIVE
Protein, ur: NEGATIVE mg/dL
Specific Gravity, Urine: 1.019 (ref 1.005–1.030)
pH: 5 (ref 5.0–8.0)

## 2020-03-25 LAB — CBG MONITORING, ED: Glucose-Capillary: 79 mg/dL (ref 70–99)

## 2020-03-25 LAB — CBC
HCT: 35 % — ABNORMAL LOW (ref 36.0–46.0)
Hemoglobin: 11.6 g/dL — ABNORMAL LOW (ref 12.0–15.0)
MCH: 30.9 pg (ref 26.0–34.0)
MCHC: 33.1 g/dL (ref 30.0–36.0)
MCV: 93.1 fL (ref 80.0–100.0)
Platelets: 232 10*3/uL (ref 150–400)
RBC: 3.76 MIL/uL — ABNORMAL LOW (ref 3.87–5.11)
RDW: 13.3 % (ref 11.5–15.5)
WBC: 8.4 10*3/uL (ref 4.0–10.5)
nRBC: 0 % (ref 0.0–0.2)

## 2020-03-25 LAB — I-STAT BETA HCG BLOOD, ED (MC, WL, AP ONLY): I-stat hCG, quantitative: >2000 m[IU]/mL — ABNORMAL HIGH (ref <5)

## 2020-03-25 MED ORDER — SODIUM CHLORIDE 0.9 % IV BOLUS
1000.0000 mL | Freq: Once | INTRAVENOUS | Status: AC
  Administered 2020-03-25: 1000 mL via INTRAVENOUS

## 2020-03-25 MED ORDER — ACETAMINOPHEN 500 MG PO TABS
1000.0000 mg | ORAL_TABLET | Freq: Once | ORAL | Status: AC
  Administered 2020-03-25: 1000 mg via ORAL
  Filled 2020-03-25: qty 2

## 2020-03-25 NOTE — ED Provider Notes (Signed)
[facility]-EMERGENCY DEPT Provider Note   CSN: [id number] Arrival date & time: 03/25/20  2043     History Chief Complaint  Patient presents with  . Loss of Consciousness    Catherine Torres is a 34 y.o. female.  The history is provided by the patient and medical records.    34 y.o. F with hx of PE no longer on anticoagulation, presenting to the ED after syncopal event.  Patient states she was eating at [employer] when she got very lightheaded and nauseated.  States she put her head down in the table as to not make a scene but reports she did pass out.  States she sat up and had people around her offering water/ice.  States she assumed it was because she is fatigued and has not been getting much sleep.  States there has been a lot going on personally.  She did recently just find out she was pregnant but has not seen GYN yet because she was not sure if she wanted to continue the pregnancy.  She denies abdominal pain, vomiting diarrhea, vaginal bleeding, pelvic pain, or vaginal discharge.  Does have history of Fe+ deficiency anemia, not currently taking her supplements or prenatal vitamins.  Past Medical History:  Diagnosis Date  . Pulmonary emboli (HCC)     There are no problems to display for this patient.   History reviewed. No pertinent surgical history.   OB History   No obstetric history on file.     Family History  Family history unknown: Yes    Social History   Tobacco Use  . Smoking status: Former Games developer  . Smokeless tobacco: Never Used  Vaping Use  . Vaping Use: Never used  Substance Use Topics  . Alcohol use: No  . Drug use: No    Home Medications Prior to Admission medications   Medication Sig Start Date End Date Taking? Authorizing Provider  clindamycin (CLEOCIN) 300 MG capsule Take 1 capsule (300 mg total) by mouth 3 (three) times daily. 12/09/19   Wallis Bamberg, PA-C  fluconazole (DIFLUCAN) 150 MG tablet Take 1 tablet (150 mg total)  by mouth once a week. 12/09/19   Wallis Bamberg, PA-C  Multiple Vitamin (MULTIVITAMIN WITH MINERALS) TABS tablet Take 1 tablet by mouth daily.    [provider]  naproxen (NAPROSYN) 500 MG tablet Take 1 tablet (500 mg total) by mouth 2 (two) times daily. 06/09/18   Sabas Sous, MD  traMADol (ULTRAM) 50 MG tablet Take 1 tablet (50 mg total) by mouth every 6 (six) hours as needed. 12/09/19   Wallis Bamberg, PA-C  pantoprazole (PROTONIX) 40 MG tablet Take 1 tablet (40 mg total) by mouth daily. Patient not taking: Reported on 06/09/2018 05/29/15 12/09/19  Cheri Fowler, PA-C  sucralfate (CARAFATE) 1 g tablet Take 1 tablet (1 g total) by mouth 4 (four) times daily. Patient not taking: Reported on 06/09/2018 05/29/15 12/09/19  Cheri Fowler, PA-C    Allergies    Penicillins and Latex  Review of Systems   Review of Systems  Neurological: Positive for syncope.  All other systems reviewed and are negative.   Physical Exam Updated Vital Signs BP 107/75 (BP Location: Left Arm)   Pulse 74   Temp 98.3 F (36.8 C) (Oral)   Resp 18   Ht 5\' 2"  (1.575 m)   Wt 73.9 kg   SpO2 100%   BMI 29.81 kg/m   Physical Exam Vitals and nursing note reviewed.  Constitutional:  Appearance: She is well-developed.     Comments: Appears fatigued  HENT:     Head: Normocephalic and atraumatic.  Eyes:     Conjunctiva/sclera: Conjunctivae normal.     Pupils: Pupils are equal, round, and reactive to light.  Cardiovascular:     Rate and Rhythm: Normal rate and regular rhythm.     Heart sounds: Normal heart sounds.  Pulmonary:     Effort: Pulmonary effort is normal. No respiratory distress.     Breath sounds: Normal breath sounds. No rhonchi.  Abdominal:     General: Bowel sounds are normal.     Palpations: Abdomen is soft.  Musculoskeletal:        General: Normal range of motion.     Cervical back: Normal range of motion.  Skin:    General: Skin is warm and dry.  Neurological:     Mental Status: She is  alert and oriented to person, place, and time.     Comments: AAOx3, answering questions and following commands appropriately; equal strength UE and LE bilaterally; CN grossly intact; moves all extremities appropriately without ataxia; no focal neuro deficits or facial asymmetry appreciated     ED Results / Procedures / Treatments   Labs (all labs ordered are listed, but only abnormal results are displayed) Labs Reviewed  BASIC METABOLIC PANEL - Abnormal; Notable for the following components:      Result Value   Glucose, Bld 100 (*)    All other components within normal limits  CBC - Abnormal; Notable for the following components:   RBC 3.76 (*)    Hemoglobin 11.6 (*)    HCT 35.0 (*)    All other components within normal limits  URINALYSIS, ROUTINE W REFLEX MICROSCOPIC - Abnormal; Notable for the following components:   APPearance HAZY (*)    Hgb urine dipstick MODERATE (*)    Ketones, ur 5 (*)    Leukocytes,Ua MODERATE (*)    Bacteria, UA RARE (*)    All other components within normal limits  I-STAT BETA HCG BLOOD, ED ([facility], [facility], [facility] ONLY) - Abnormal; Notable for the following components:   I-stat hCG, quantitative >2,000.0 (*)    All other components within normal limits  CBG MONITORING, ED  CBG MONITORING, ED    EKG None  Radiology No results found.  Procedures Procedures (including critical care time)  Medications Ordered in ED Medications  sodium chloride 0.9 % bolus 1,000 mL (has no administration in time range)  acetaminophen (TYLENOL) tablet 1,000 mg (has no administration in time range)    ED Course  I have reviewed the triage vital signs and the nursing notes.  Pertinent labs & imaging results that were available during my care of the patient were reviewed by me and considered in my medical decision making (see chart for details).    MDM Rules/Calculators/A&P  34 year old female presenting to the ED after syncopal event.  Was eating lunch at [facility] when she  felt lightheaded to put her head down.  She reports loss of consciousness but no fall or head trauma.  Here she is awake, alert, appropriately oriented.  No focal neurologic deficits.  Recently found out she was pregnant, has not decided if she wants to continue with pregnancy and has not told her children.  She requested that we not speak about this while in the room.  She has not had any recent abdominal pain, vaginal bleeding, pelvic pain, or discharge.  No urinary symptoms.  Plans to see  her OB/GYN soon.  Labs today consistent with pregnancy, no significant electrolyte derangement.  Does have slight anemia which is known.  She is not currently on her iron supplements or prenatal vitamins.  States she just feels incredibly exhausted as there has been "a lot going on".  Given IV fluids and Tylenol.  Will reassess.  12:16 AM Patient states she is feeling better after IVF.  She was able to walk to bathroom by herself without issue.  No vomiting.  Remains without abdominal pain or pelvic pain.  She is newly pregnant but with stable VS and lack of abdominal pain, low suspicion for ruptured ectopic or other OB emergency as cause of her syncope.  Seems to be more exhaustion and lack of sleep.  She will however need to follow-up with her doctor later this week as scheduled.  Encouraged to eat regularly, drink lots of fluids.  Return here for any new/acute changes.  Final Clinical Impression(s) / ED Diagnoses Final diagnoses:  Syncope, unspecified syncope type    Rx / DC Orders ED Discharge Orders    None       Garlon Hatchet, PA-C 03/26/20 0046    Milagros Loll, MD 03/28/20 1504

## 2020-03-25 NOTE — ED Triage Notes (Addendum)
Pt reports that she fainted 2x today. States that she was sitting and put her head down so she didn't fall, but states that she did pass out. Endorses headache now. States that dizziness started on Thursday.

## 2020-03-26 NOTE — ED Notes (Signed)
Pt ambulated to BR without assistance

## 2020-03-26 NOTE — Discharge Instructions (Signed)
Make sure to drink lots of fluids to stay hydrated.  Eat regularly.   Follow-up with your doctor like we discussed. Return here for new concerns.

## 2020-05-21 ENCOUNTER — Other Ambulatory Visit: Payer: Medicaid Other

## 2020-05-21 DIAGNOSIS — Z20822 Contact with and (suspected) exposure to covid-19: Secondary | ICD-10-CM

## 2020-05-24 LAB — NOVEL CORONAVIRUS, NAA: SARS-CoV-2, NAA: NOT DETECTED

## 2020-05-24 LAB — SARS-COV-2, NAA 2 DAY TAT

## 2020-08-08 ENCOUNTER — Encounter (HOSPITAL_COMMUNITY): Payer: Self-pay

## 2020-08-08 ENCOUNTER — Emergency Department (HOSPITAL_COMMUNITY): Payer: BC Managed Care – PPO

## 2020-08-08 ENCOUNTER — Emergency Department (HOSPITAL_COMMUNITY)
Admission: EM | Admit: 2020-08-08 | Discharge: 2020-08-08 | Disposition: A | Discharge status: Home / Self Care | Payer: BC Managed Care – PPO | Attending: Emergency Medicine | Admitting: Emergency Medicine

## 2020-08-08 DIAGNOSIS — Z87891 Personal history of nicotine dependence: Secondary | ICD-10-CM | POA: Diagnosis not present

## 2020-08-08 DIAGNOSIS — R079 Chest pain, unspecified: Secondary | ICD-10-CM

## 2020-08-08 DIAGNOSIS — Z91040 Latex allergy status: Secondary | ICD-10-CM | POA: Diagnosis not present

## 2020-08-08 DIAGNOSIS — R072 Precordial pain: Secondary | ICD-10-CM | POA: Diagnosis present

## 2020-08-08 LAB — D-DIMER, QUANTITATIVE: D-Dimer, Quant: 0.55 ug/mL-FEU — ABNORMAL HIGH (ref 0.00–0.50)

## 2020-08-08 LAB — BASIC METABOLIC PANEL
Anion gap: 6 (ref 5–15)
BUN: 14 mg/dL (ref 6–20)
CO2: 26 mmol/L (ref 22–32)
Calcium: 8.9 mg/dL (ref 8.9–10.3)
Chloride: 108 mmol/L (ref 98–111)
Creatinine, Ser: 0.64 mg/dL (ref 0.44–1.00)
GFR, Estimated: >60 mL/min (ref >60)
Glucose, Bld: 92 mg/dL (ref 70–99)
Potassium: 4 mmol/L (ref 3.5–5.1)
Sodium: 140 mmol/L (ref 135–145)

## 2020-08-08 LAB — CBC
HCT: 36.2 % (ref 36.0–46.0)
Hemoglobin: 11.6 g/dL — ABNORMAL LOW (ref 12.0–15.0)
MCH: 30.5 pg (ref 26.0–34.0)
MCHC: 32 g/dL (ref 30.0–36.0)
MCV: 95.3 fL (ref 80.0–100.0)
Platelets: 243 10*3/uL (ref 150–400)
RBC: 3.8 MIL/uL — ABNORMAL LOW (ref 3.87–5.11)
RDW: 13.4 % (ref 11.5–15.5)
WBC: 3.1 10*3/uL — ABNORMAL LOW (ref 4.0–10.5)
nRBC: 0 % (ref 0.0–0.2)

## 2020-08-08 LAB — TROPONIN I (HIGH SENSITIVITY)
Troponin I (High Sensitivity): 2 ng/L (ref <18)
Troponin I (High Sensitivity): 2 ng/L (ref <18)

## 2020-08-08 LAB — I-STAT BETA HCG BLOOD, ED (MC, WL, AP ONLY): I-stat hCG, quantitative: <5 m[IU]/mL (ref <5)

## 2020-08-08 MED ORDER — IOHEXOL 350 MG/ML SOLN
100.0000 mL | Freq: Once | INTRAVENOUS | Status: AC | PRN
  Administered 2020-08-08: 62 mL via INTRAVENOUS

## 2020-08-08 MED ORDER — KETOROLAC TROMETHAMINE 30 MG/ML IJ SOLN
30.0000 mg | Freq: Once | INTRAMUSCULAR | Status: AC
  Administered 2020-08-08: 30 mg via INTRAVENOUS
  Filled 2020-08-08: qty 1

## 2020-08-08 MED ORDER — SODIUM CHLORIDE 0.9 % IV BOLUS
500.0000 mL | Freq: Once | INTRAVENOUS | Status: AC
  Administered 2020-08-08: 500 mL via INTRAVENOUS

## 2020-08-08 NOTE — ED Triage Notes (Signed)
Pt arrived via walk in, c/o sternal chest pain, reproducible with deep breathing/mvmt cont since last night that started while sitting on the couch watching TV.

## 2020-08-08 NOTE — ED Notes (Signed)
Lab notified and aware of D-dimer add on test.

## 2020-08-08 NOTE — ED Notes (Signed)
Spoke with lab regarding pt's pending CBC that was previously sent by this nurse at [phone]. Per Velna Hatchet in lab, CBC being run at this time.

## 2020-08-08 NOTE — ED Notes (Signed)
Pt to CT for CTA 

## 2020-08-08 NOTE — Discharge Instructions (Signed)
You have been seen and discharged from the emergency department.  Your heart and lung work-up today was normal.  Treat the pain is musculoskeletal.  Follow-up with your primary provider for reevaluation and further care. Take home medications as prescribed. If you have any worsening symptoms or further concerns for health please return to an emergency department for further evaluation.

## 2020-08-08 NOTE — ED Notes (Signed)
Pt given blanket as per requested.

## 2020-08-08 NOTE — ED Notes (Signed)
Pt back in room from bathroom. Provider at the bedside.

## 2020-08-08 NOTE — ED Notes (Signed)
Pt to xray

## 2020-08-08 NOTE — ED Notes (Signed)
Pt up and ambulatory to bathroom.

## 2020-08-08 NOTE — ED Notes (Signed)
Pt ambulatory to ED exam room, accompanied by ED tech. Per ED tech, EKG completed in triage and given to provider. Pt attached to cardiac monitor x3 in exam room. VSS at this time.

## 2020-08-08 NOTE — ED Provider Notes (Signed)
[facility]-EMERGENCY DEPT Provider Note   CSN: [id number] Arrival date & time: 08/08/20  1517     History Chief Complaint  Patient presents with  . Chest Pain    Catherine Torres is a 35 y.o. female.  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  Patient states while sitting on the couch last night relaxing she had midsternal chest pain.  She describes it as a sharp heaviness.  Since it started its been constant, it is reproducible with deep breaths and certain movements.  She was able to sleep without issue overnight but woke up with the chest discomfort present so she came in for evaluation.  Denies any fever, shortness of breath, cough, abdominal pain, vomiting/diarrhea.  She said no swelling of her lower extremities.  No recent traveling.  Currently denies any hormonal use.  Past Medical History:  Diagnosis Date  . Pulmonary emboli (HCC)     There are no problems to display for this patient.   History reviewed. No pertinent surgical history.   OB History   No obstetric history on file.     Family History  Family history unknown: Yes    Social History   Tobacco Use  . Smoking status: Former Games developer  . Smokeless tobacco: Never Used  Vaping Use  . Vaping Use: Never used  Substance Use Topics  . Alcohol use: No  . Drug use: No    Home Medications Prior to Admission medications   Medication Sig Start Date End Date Taking? Authorizing Provider  albuterol (VENTOLIN HFA) 108 (90 Base) MCG/ACT inhaler Inhale 2 puffs into the lungs every 4 (four) hours as needed. 01/19/20   [provider]  clindamycin (CLEOCIN) 300 MG capsule Take 1 capsule (300 mg total) by mouth 3 (three) times daily. Patient not taking: Reported on 03/25/2020 12/09/19   Wallis Bamberg, PA-C  famotidine (PEPCID) 20 MG  tablet Take 20 mg by mouth at bedtime as needed. 12/13/19   [provider]  fluconazole (DIFLUCAN) 150 MG tablet Take 1 tablet (150 mg total) by mouth once a week. Patient not taking: Reported on 03/25/2020 12/09/19   Wallis Bamberg, PA-C  Multiple Vitamin (MULTIVITAMIN WITH MINERALS) TABS tablet Take 1 tablet by mouth daily. Patient not taking: Reported on 03/25/2020    [provider]  naproxen (NAPROSYN) 500 MG tablet Take 1 tablet (500 mg total) by mouth 2 (two) times daily. 06/09/18   Sabas Sous, MD  traMADol (ULTRAM) 50 MG tablet Take 1 tablet (50 mg total) by mouth every 6 (six) hours as needed. Patient not taking: Reported on 03/25/2020 12/09/19   Wallis Bamberg, PA-C  pantoprazole (PROTONIX) 40 MG tablet Take 1 tablet (40 mg total) by mouth daily. Patient not taking: Reported on 06/09/2018 05/29/15 12/09/19  Cheri Fowler, PA-C  sucralfate (CARAFATE) 1 g tablet Take 1 tablet (1 g total) by mouth 4 (four) times daily. Patient not taking: Reported on 06/09/2018 05/29/15 12/09/19  Cheri Fowler, PA-C    Allergies    Latex and Penicillins  Review of Systems   Review of Systems  Constitutional: Negative for chills and fever.  HENT: Negative for congestion.   Eyes: Negative for visual disturbance.  Respiratory: Negative for chest tightness and shortness of breath.   Cardiovascular: Positive for chest pain. Negative for palpitations and leg swelling.  Gastrointestinal: Negative for  abdominal pain, diarrhea and vomiting.  Genitourinary: Negative for dysuria.  Skin: Negative for rash.  Neurological: Negative for headaches.    Physical Exam Updated Vital Signs BP 108/79 (BP Location: Right Arm)   Pulse 76   Temp 98.2 F (36.8 C)   Resp 18   Ht 5\' 2"  (1.575 m)   Wt 70.8 kg   LMP 08/08/2020   SpO2 100%   BMI 28.53 kg/m   Physical Exam Vitals and nursing note reviewed.  Constitutional:      Appearance: Normal appearance.  HENT:     Head: Normocephalic.      Mouth/Throat:     Mouth: Mucous membranes are moist.  Cardiovascular:     Rate and Rhythm: Normal rate.  Pulmonary:     Effort: Pulmonary effort is normal. No respiratory distress.     Breath sounds: No decreased breath sounds, wheezing or rales.  Abdominal:     Palpations: Abdomen is soft.     Tenderness: There is no abdominal tenderness.  Musculoskeletal:     Right lower leg: No edema.     Left lower leg: No edema.     Comments: Certain movements does make the chest pain worse but it is not particularly reproducible  Skin:    General: Skin is warm.  Neurological:     Mental Status: She is alert and oriented to person, place, and time. Mental status is at baseline.  Psychiatric:        Mood and Affect: Mood normal.     ED Results / Procedures / Treatments   Labs (all labs ordered are listed, but only abnormal results are displayed) Labs Reviewed  BASIC METABOLIC PANEL  CBC  D-DIMER, QUANTITATIVE  I-STAT BETA HCG BLOOD, ED ([facility], [facility], [facility] ONLY)  TROPONIN I (HIGH SENSITIVITY)    EKG EKG Interpretation  Date/Time:  Wednesday August 08 2020 09:02:54 EDT Ventricular Rate:  71 PR Interval:  145 QRS Duration: 78 QT Interval:  387 QTC Calculation: 421 R Axis:   74 Text Interpretation: Sinus rhythm 12 Lead; Mason-Likar NSR, normal EKG Confirmed by 09-06-1993 ([id number]) on 08/08/2020 9:23:53 AM   Radiology No results found.  Procedures Procedures   Medications Ordered in ED Medications  ketorolac (TORADOL) 30 MG/ML injection 30 mg (has no administration in time range)  sodium chloride 0.9 % bolus 500 mL (has no administration in time range)    ED Course  I have reviewed the triage vital signs and the nursing notes.  Pertinent labs & imaging results that were available during my care of the patient were reviewed by me and considered in my medical decision making (see chart for details).    MDM Rules/Calculators/A&P                          35 year old female  presents the emergency department with concern for chest discomfort.  Chest discomfort is reproducible and worse with certain movements but not palpation.  Arrival EKG shows no ischemic changes, pregnancy test is negative.  Will evaluate with blood work, cardiac enzymes, chest x-ray as well as dimer with history of PE.  Blood work is normal and reassuring, 2 - troponins with no delta.  Given her history of previous PE D-dimer was done, this was elevated.  CTA of the chest shows no pulmonary embolism or other acute abnormality.  Given how movement related this discomfort is I think is musculoskeletal.  Low suspicion for other acute intrathoracic pathology.  Patient is well-appearing, sitting up, states that the discomfort is almost completely gone.  Patient will be discharged and treated as an outpatient.  Discharge plan and strict return to ED precautions discussed, patient verbalizes understanding and agreement.  Final Clinical Impression(s) / ED Diagnoses Final diagnoses:  None    Rx / DC Orders ED Discharge Orders    None       Rozelle Logan, DO 08/08/20 1351

## 2020-08-08 NOTE — ED Notes (Signed)
Pt back in room from XRAY

## 2020-10-01 ENCOUNTER — Encounter (HOSPITAL_COMMUNITY): Payer: Self-pay | Admitting: Emergency Medicine

## 2020-10-01 ENCOUNTER — Other Ambulatory Visit: Payer: Self-pay

## 2020-10-01 ENCOUNTER — Ambulatory Visit (HOSPITAL_COMMUNITY)
Admission: EM | Admit: 2020-10-01 | Discharge: 2020-10-01 | Disposition: A | Discharge status: Home / Self Care | Payer: BC Managed Care – PPO | Attending: Emergency Medicine | Admitting: Emergency Medicine

## 2020-10-01 DIAGNOSIS — J069 Acute upper respiratory infection, unspecified: Secondary | ICD-10-CM | POA: Diagnosis not present

## 2020-10-01 HISTORY — DX: Anemia, unspecified: D64.9

## 2020-10-01 HISTORY — DX: Acute embolism and thrombosis of unspecified deep veins of unspecified lower extremity: I82.409

## 2020-10-01 HISTORY — DX: Other pulmonary embolism without acute cor pulmonale: I26.99

## 2020-10-01 LAB — POCT RAPID STREP A, ED / UC: Streptococcus, Group A Screen (Direct): NEGATIVE

## 2020-10-01 MED ORDER — ONDANSETRON HCL 4 MG PO TABS: 4.0000 mg | ORAL_TABLET | Freq: Four times a day (QID) | ORAL | 0 refills | Status: AC

## 2020-10-01 MED ORDER — GUAIFENESIN-DM 100-10 MG/5ML PO SYRP: 5.0000 mL | ORAL_SOLUTION | ORAL | 0 refills | Status: DC | PRN

## 2020-10-01 MED ORDER — BENZONATATE 100 MG PO CAPS: 100.0000 mg | ORAL_CAPSULE | Freq: Three times a day (TID) | ORAL | 0 refills | Status: DC

## 2020-10-01 NOTE — ED Provider Notes (Signed)
[facility]    CSN: [id number] Arrival date & time: 10/01/20  1009      History   Chief Complaint Chief Complaint  Patient presents with  . Cough  . Nausea    HPI Catherine Torres is a 35 y.o. female.   Patient presents with burning sensation of throat, mild generalized headache, nausea without vomiting, non productive cough, laryngitis that has resolved watery diarrhea, last occurrence 2 days ago, fever, body aches and chills starting 3 days ago. Was tolerating food until yesterday. Tolerating liquids. Has taken three at home COVID test. All negative. No known sick contacts. Works in Games developer. Denies chest pain, shortness of breath, ear pain.   Past Medical History:  Diagnosis Date  . Anemia   . DVT (deep venous thrombosis) (HCC)   . Pulmonary emboli (HCC)   . Pulmonary embolism (HCC)     There are no problems to display for this patient.   Past Surgical History:  Procedure Laterality Date  . CESAREAN SECTION      OB History   No obstetric history on file.      Home Medications    Prior to Admission medications   Medication Sig Start Date End Date Taking? Authorizing Provider  benzonatate (TESSALON) 100 MG capsule Take 1 capsule (100 mg total) by mouth every 8 (eight) hours. 10/01/20  Yes Anthonie Lotito R, NP  guaiFENesin-dextromethorphan (ROBITUSSIN DM) 100-10 MG/5ML syrup Take 5 mLs by mouth every 4 (four) hours as needed for cough. 10/01/20  Yes Damien Cisar R, NP  naproxen (NAPROSYN) 500 MG tablet Take 1 tablet (500 mg total) by mouth 2 (two) times daily. 06/09/18  Yes Sabas Sous, MD  ondansetron (ZOFRAN) 4 MG tablet Take 1 tablet (4 mg total) by mouth every 6 (six) hours. 10/01/20  Yes Cuyler Vandyken R, NP  albuterol (VENTOLIN HFA) 108 (90 Base) MCG/ACT inhaler Inhale 2 puffs into the lungs every 4 (four) hours as needed. 01/19/20   [provider]  clindamycin (CLEOCIN) 300 MG capsule Take 1 capsule (300 mg total) by mouth 3 (three)  times daily. Patient not taking: Reported on 03/25/2020 12/09/19   Wallis Bamberg, PA-C  famotidine (PEPCID) 20 MG tablet Take 20 mg by mouth at bedtime as needed. 12/13/19   [provider]  fluconazole (DIFLUCAN) 150 MG tablet Take 1 tablet (150 mg total) by mouth once a week. Patient not taking: Reported on 03/25/2020 12/09/19   Wallis Bamberg, PA-C  Multiple Vitamin (MULTIVITAMIN WITH MINERALS) TABS tablet Take 1 tablet by mouth daily. Patient not taking: Reported on 03/25/2020    [provider]  traMADol (ULTRAM) 50 MG tablet Take 1 tablet (50 mg total) by mouth every 6 (six) hours as needed. Patient not taking: Reported on 03/25/2020 12/09/19   Wallis Bamberg, PA-C  pantoprazole (PROTONIX) 40 MG tablet Take 1 tablet (40 mg total) by mouth daily. Patient not taking: Reported on 06/09/2018 05/29/15 12/09/19  Cheri Fowler, PA-C  sucralfate (CARAFATE) 1 g tablet Take 1 tablet (1 g total) by mouth 4 (four) times daily. Patient not taking: Reported on 06/09/2018 05/29/15 12/09/19  Cheri Fowler, PA-C    Family History Family History  Problem Relation Age of Onset  . Healthy Mother   . Healthy Father     Social History Social History   Tobacco Use  . Smoking status: Former Games developer  . Smokeless tobacco: Never Used  Vaping Use  . Vaping Use: Every day  Substance Use Topics  .  Alcohol use: No  . Drug use: No     Allergies   Latex and Penicillins   Review of Systems Review of Systems  Defer to HPI   Physical Exam Triage Vital Signs ED Triage Vitals  Enc Vitals Group     BP 10/01/20 1129 125/86     Pulse Rate 10/01/20 1129 87     Resp 10/01/20 1129 20     Temp 10/01/20 1129 99.3 F (37.4 C)     Temp Source 10/01/20 1129 Oral     SpO2 10/01/20 1129 97 %     Weight --      Height --      Head Circumference --      Peak Flow --      Pain Score 10/01/20 1124 5     Pain Loc --      Pain Edu? --      Excl. in GC? --    No data found.  Updated Vital Signs BP 125/86  (BP Location: Left Arm)   Pulse 87   Temp 99.3 F (37.4 C) (Oral)   Resp 20   LMP 09/18/2020   SpO2 97%   Visual Acuity Right Eye Distance:   Left Eye Distance:   Bilateral Distance:    Right Eye Near:   Left Eye Near:    Bilateral Near:     Physical Exam Constitutional:      Appearance: Normal appearance. She is normal weight.  HENT:     Head: Normocephalic.     Right Ear: Tympanic membrane, ear canal and external ear normal.     Left Ear: Tympanic membrane, ear canal and external ear normal.     Nose: Congestion and rhinorrhea present.     Mouth/Throat:     Mouth: Mucous membranes are moist.     Pharynx: Oropharynx is clear. Posterior oropharyngeal erythema present.  Eyes:     Extraocular Movements: Extraocular movements intact.     Conjunctiva/sclera: Conjunctivae normal.     Pupils: Pupils are equal, round, and reactive to light.  Cardiovascular:     Rate and Rhythm: Normal rate and regular rhythm.     Pulses: Normal pulses.     Heart sounds: Normal heart sounds.  Pulmonary:     Effort: Pulmonary effort is normal.     Breath sounds: Normal breath sounds.  Abdominal:     General: Abdomen is flat. Bowel sounds are normal.     Palpations: Abdomen is soft.     Tenderness: There is abdominal tenderness in the epigastric area, periumbilical area and suprapubic area. There is no right CVA tenderness, left CVA tenderness, guarding or rebound.     Hernia: No hernia is present.  Musculoskeletal:        General: Normal range of motion.     Cervical back: Normal range of motion.  Lymphadenopathy:     Cervical: Cervical adenopathy present.  Skin:    General: Skin is warm and dry.  Neurological:     Mental Status: She is alert and oriented to person, place, and time. Mental status is at baseline.  Psychiatric:        Mood and Affect: Mood normal.        Behavior: Behavior normal.        Thought Content: Thought content normal.        Judgment: Judgment normal.       UC Treatments / Results  Labs (all labs ordered are listed, but only abnormal  results are displayed) Labs Reviewed  POCT RAPID STREP A, ED / UC    EKG   Radiology No results found.  Procedures Procedures (including critical care time)  Medications Ordered in UC Medications - No data to display  Initial Impression / Assessment and Plan / UC Course  I have reviewed the triage vital signs and the nursing notes.  Pertinent labs & imaging results that were available during my care of the patient were reviewed by me and considered in my medical decision making (see chart for details).  Viral URI with cough  1. Unable to complete flu test due to lack of supplies 2. Tessalon 100 mg tid prn 3. Robitussin DM 5 mL every 4 hours prn 4. zofran 4 mg every 6 hours prn 5. otc tylenol or ibuprofen for body aches, headache, fever 6. Rapid strep negative 7. 3 day work note given  Final Clinical Impressions(s) / UC Diagnoses   Final diagnoses:  Viral URI with cough     Discharge Instructions     Can use tessalon pills three times a day as needed  Can use 5 mL of robitussin every 4 hours as needed for cough and congestion  Can use tylenol 650 mg or ibuprofen 400 mg every 6 hours as needed for body aches and headaches  Can use zofran every 6 hours as needed   Continue to try to use eat and drink to prevent dehydration   Covid test pending 24 hours, you will be called if positive    ED Prescriptions    Medication Sig Dispense Auth. Provider   benzonatate (TESSALON) 100 MG capsule Take 1 capsule (100 mg total) by mouth every 8 (eight) hours. 21 capsule Rodolfo Notaro R, NP   guaiFENesin-dextromethorphan (ROBITUSSIN DM) 100-10 MG/5ML syrup Take 5 mLs by mouth every 4 (four) hours as needed for cough. 118 mL Joslynn Jamroz R, NP   ondansetron (ZOFRAN) 4 MG tablet Take 1 tablet (4 mg total) by mouth every 6 (six) hours. 12 tablet Izabella Marcantel, Elita Boone, NP     PDMP not  reviewed this encounter.   Valinda Hoar, NP 10/01/20 1241

## 2020-10-01 NOTE — Discharge Instructions (Addendum)
Can use tessalon pills three times a day as needed  Can use 5 mL of robitussin every 4 hours as needed for cough and congestion  Can use tylenol 650 mg or ibuprofen 400 mg every 6 hours as needed for body aches and headaches  Can use zofran every 6 hours as needed   Continue to try to use eat and drink to prevent dehydration   Covid test pending 24 hours, you will be called if positive

## 2020-10-01 NOTE — ED Triage Notes (Addendum)
Friday night throat started itching.  Saturday had laryngitis, cough,  and throat on fire.  Saturday night had fever, body aches starting on right side of body. Reports fever again yesterday and last night.  During the night started nausea, vomiting and diarrhea.

## 2020-10-04 LAB — CULTURE, GROUP A STREP (THRC)

## 2020-12-05 ENCOUNTER — Other Ambulatory Visit: Payer: Self-pay

## 2020-12-05 ENCOUNTER — Ambulatory Visit (HOSPITAL_COMMUNITY)
Admission: EM | Admit: 2020-12-05 | Discharge: 2020-12-05 | Disposition: A | Discharge status: Home / Self Care | Payer: BC Managed Care – PPO | Attending: Internal Medicine | Admitting: Internal Medicine

## 2020-12-05 ENCOUNTER — Encounter (HOSPITAL_COMMUNITY): Payer: Self-pay | Admitting: Emergency Medicine

## 2020-12-05 DIAGNOSIS — M5432 Sciatica, left side: Secondary | ICD-10-CM | POA: Diagnosis not present

## 2020-12-05 DIAGNOSIS — M5431 Sciatica, right side: Secondary | ICD-10-CM | POA: Diagnosis not present

## 2020-12-05 DIAGNOSIS — R102 Pelvic and perineal pain: Secondary | ICD-10-CM

## 2020-12-05 LAB — POCT URINALYSIS DIPSTICK, ED / UC
Bilirubin Urine: NEGATIVE
Glucose, UA: NEGATIVE mg/dL
Ketones, ur: NEGATIVE mg/dL
Nitrite: NEGATIVE
Protein, ur: 30 mg/dL — AB
Specific Gravity, Urine: >=1.03 (ref 1.005–1.030)
Urobilinogen, UA: 0.2 mg/dL (ref 0.0–1.0)
pH: 6.5 (ref 5.0–8.0)

## 2020-12-05 LAB — POC URINE PREG, ED: Preg Test, Ur: NEGATIVE

## 2020-12-05 MED ORDER — PREDNISONE 20 MG PO TABS: 40.0000 mg | ORAL_TABLET | Freq: Every day | ORAL | 0 refills | Status: AC

## 2020-12-05 NOTE — Discharge Instructions (Addendum)
Take the prednisone daily for the next 5 days.  You can use heat, ice, or alternate between heat and ice.    Rest.  Gentle stretching and exercises can help with back pain.   Follow up with your GYN for further evaluation of pelvic/groin pain.    Return or go to the Emergency Department if symptoms worsen or do not improve in the next few days.

## 2020-12-05 NOTE — ED Provider Notes (Signed)
[facility]    CSN: [id number] Arrival date & time: 12/05/20  1524      History   Chief Complaint Chief Complaint  Patient presents with   Back Pain    HPI Catherine Torres is a 35 y.o. female.   Patient here for evaluation of lower pelvic pain and cramping that started several days ago.  Also reports lower back pain.  Reports started period on Monday but states that pain is worse than normal period cramping.  Reports pain radiates down back, into groin, and down bilateral legs.  Denies any dysuria, urgency, or frequency.  Denies any fevers, chest pain, shortness of breath, N/V/D, numbness, tingling, weakness, abdominal pain, or headaches.     The history is provided by the patient.  Back Pain Associated symptoms: pelvic pain   Associated symptoms: no dysuria    Past Medical History:  Diagnosis Date   Anemia    DVT (deep venous thrombosis) (HCC)    Pulmonary emboli (HCC)    Pulmonary embolism (HCC)     There are no problems to display for this patient.   Past Surgical History:  Procedure Laterality Date   CESAREAN SECTION      OB History   No obstetric history on file.      Home Medications    Prior to Admission medications   Medication Sig Start Date End Date Taking? Authorizing Provider  predniSONE (DELTASONE) 20 MG tablet Take 2 tablets (40 mg total) by mouth daily for 5 days. 12/05/20 12/10/20 Yes Ivette Loyal, NP  albuterol (VENTOLIN HFA) 108 (90 Base) MCG/ACT inhaler Inhale 2 puffs into the lungs every 4 (four) hours as needed. 01/19/20   [provider]  benzonatate (TESSALON) 100 MG capsule Take 1 capsule (100 mg total) by mouth every 8 (eight) hours. Patient not taking: Reported on 12/05/2020 10/01/20   Valinda Hoar, NP  clindamycin (CLEOCIN) 300 MG capsule Take 1 capsule (300 mg total) by mouth 3 (three) times daily. Patient not taking: Reported on 03/25/2020 12/09/19   Wallis Bamberg, PA-C  famotidine (PEPCID) 20 MG tablet Take 20 mg by  mouth at bedtime as needed. 12/13/19   [provider]  fluconazole (DIFLUCAN) 150 MG tablet Take 1 tablet (150 mg total) by mouth once a week. Patient not taking: Reported on 03/25/2020 12/09/19   Wallis Bamberg, PA-C  guaiFENesin-dextromethorphan [facility] DM) 100-10 MG/5ML syrup Take 5 mLs by mouth every 4 (four) hours as needed for cough. Patient not taking: Reported on 12/05/2020 10/01/20   Valinda Hoar, NP  Multiple Vitamin (MULTIVITAMIN WITH MINERALS) TABS tablet Take 1 tablet by mouth daily. Patient not taking: Reported on 03/25/2020    [provider]  naproxen (NAPROSYN) 500 MG tablet Take 1 tablet (500 mg total) by mouth 2 (two) times daily. 06/09/18   Sabas Sous, MD  ondansetron (ZOFRAN) 4 MG tablet Take 1 tablet (4 mg total) by mouth every 6 (six) hours. Patient not taking: Reported on 12/05/2020 10/01/20   Valinda Hoar, NP  traMADol (ULTRAM) 50 MG tablet Take 1 tablet (50 mg total) by mouth every 6 (six) hours as needed. Patient not taking: Reported on 03/25/2020 12/09/19   Wallis Bamberg, PA-C  pantoprazole (PROTONIX) 40 MG tablet Take 1 tablet (40 mg total) by mouth daily. Patient not taking: Reported on 06/09/2018 05/29/15 12/09/19  Cheri Caiya Bettes, PA-C  sucralfate (CARAFATE) 1 g tablet Take 1 tablet (1 g total) by mouth 4 (four) times daily. Patient not  taking: Reported on 06/09/2018 05/29/15 12/09/19  Cheri Leander Tout, PA-C    Family History Family History  Problem Relation Age of Onset   Healthy Mother    Healthy Father     Social History Social History   Tobacco Use   Smoking status: Former   Smokeless tobacco: Never  Building services engineer Use: Every day  Substance Use Topics   Alcohol use: No   Drug use: No     Allergies   Latex and Penicillins   Review of Systems Review of Systems  Genitourinary:  Positive for flank pain and pelvic pain. Negative for dysuria, frequency and urgency.  Musculoskeletal:  Positive for back pain.  All other systems  reviewed and are negative.   Physical Exam Triage Vital Signs ED Triage Vitals  Enc Vitals Group     BP 12/05/20 1600 105/70     Pulse Rate 12/05/20 1600 76     Resp 12/05/20 1600 18     Temp 12/05/20 1600 99.6 F (37.6 C)     Temp Source 12/05/20 1600 Oral     SpO2 12/05/20 1600 100 %     Weight --      Height --      Head Circumference --      Peak Flow --      Pain Score 12/05/20 1556 7     Pain Loc --      Pain Edu? --      Excl. in GC? --    No data found.  Updated Vital Signs BP 105/70 (BP Location: Right Arm)   Pulse 76   Temp 99.6 F (37.6 C) (Oral)   Resp 18   LMP 12/02/2020   SpO2 100%   Visual Acuity Right Eye Distance:   Left Eye Distance:   Bilateral Distance:    Right Eye Near:   Left Eye Near:    Bilateral Near:     Physical Exam Vitals and nursing note reviewed.  Constitutional:      General: She is not in acute distress.    Appearance: Normal appearance. She is not ill-appearing, toxic-appearing or diaphoretic.  HENT:     Head: Normocephalic and atraumatic.  Eyes:     Conjunctiva/sclera: Conjunctivae normal.  Cardiovascular:     Rate and Rhythm: Normal rate.     Pulses: Normal pulses.  Pulmonary:     Effort: Pulmonary effort is normal.  Abdominal:     General: Abdomen is flat.     Palpations: Abdomen is soft.  Musculoskeletal:     Cervical back: Normal range of motion.     Lumbar back: Tenderness present. No bony tenderness. Normal range of motion. Positive right straight leg raise test and positive left straight leg raise test.  Skin:    General: Skin is warm and dry.  Neurological:     General: No focal deficit present.     Mental Status: She is alert and oriented to person, place, and time.  Psychiatric:        Mood and Affect: Mood normal.     UC Treatments / Results  Labs (all labs ordered are listed, but only abnormal results are displayed) Labs Reviewed  POCT URINALYSIS DIPSTICK, ED / UC - Abnormal; Notable for the  following components:      Result Value   Hgb urine dipstick LARGE (*)    Protein, ur 30 (*)    Leukocytes,Ua SMALL (*)    All other components within normal limits  URINE CULTURE  POC URINE PREG, ED    EKG   Radiology No results found.  Procedures Procedures (including critical care time)  Medications Ordered in UC Medications - No data to display  Initial Impression / Assessment and Plan / UC Course  I have reviewed the triage vital signs and the nursing notes.  Pertinent labs & imaging results that were available during my care of the patient were reviewed by me and considered in my medical decision making (see chart for details).    Assessment negative for red flags or concerns.  Urinalysis with large hgb, protein, and small leukocytes.  Urine culture pending.  Pregnancy test negative.  Pain possibly due to sciatica.  Will treat with prednisone daily for the next 5 days.  Encourage rest and gentle stretching/exercises.  May try heat, ice, or alternate for comfort.  Recommend following up with GYN for further evaluation of pelvic/groin pain.   Final Clinical Impressions(s) / UC Diagnoses   Final diagnoses:  Bilateral sciatica  Pelvic pain     Discharge Instructions      Take the prednisone daily for the next 5 days.  You can use heat, ice, or alternate between heat and ice.    Rest.  Gentle stretching and exercises can help with back pain.   Follow up with your GYN for further evaluation of pelvic/groin pain.    Return or go to the Emergency Department if symptoms worsen or do not improve in the next few days.      ED Prescriptions     Medication Sig Dispense Auth. Provider   predniSONE (DELTASONE) 20 MG tablet Take 2 tablets (40 mg total) by mouth daily for 5 days. 10 tablet Ivette Loyal, NP      PDMP not reviewed this encounter.   Ivette Loyal, NP 12/05/20 [phone]

## 2020-12-05 NOTE — ED Triage Notes (Signed)
Lower back pain and lower abdominal cramping and pressure.  Onset of symptoms started Sunday with menstrual cycle.  Cramp and shooting pain is different and is making things worse.  Pressure worsens with standing and walking.  No burning with urination.  Vaginal discharge prior to menstrual cycle

## 2021-06-12 ENCOUNTER — Other Ambulatory Visit: Payer: Self-pay

## 2021-06-12 ENCOUNTER — Ambulatory Visit (HOSPITAL_COMMUNITY)
Admission: EM | Admit: 2021-06-12 | Discharge: 2021-06-12 | Disposition: A | Discharge status: Home / Self Care | Payer: BC Managed Care – PPO | Attending: Physician Assistant | Admitting: Physician Assistant

## 2021-06-12 ENCOUNTER — Encounter (HOSPITAL_COMMUNITY): Payer: Self-pay

## 2021-06-12 DIAGNOSIS — R110 Nausea: Secondary | ICD-10-CM | POA: Diagnosis not present

## 2021-06-12 DIAGNOSIS — R1013 Epigastric pain: Secondary | ICD-10-CM | POA: Insufficient documentation

## 2021-06-12 LAB — CBC WITH DIFFERENTIAL/PLATELET
Abs Immature Granulocytes: 0.01 10*3/uL (ref 0.00–0.07)
Basophils Absolute: 0 10*3/uL (ref 0.0–0.1)
Basophils Relative: 1 %
Eosinophils Absolute: 0 10*3/uL (ref 0.0–0.5)
Eosinophils Relative: 1 %
HCT: 38 % (ref 36.0–46.0)
Hemoglobin: 12.2 g/dL (ref 12.0–15.0)
Immature Granulocytes: 0 %
Lymphocytes Relative: 49 %
Lymphs Abs: 1.9 10*3/uL (ref 0.7–4.0)
MCH: 30.2 pg (ref 26.0–34.0)
MCHC: 32.1 g/dL (ref 30.0–36.0)
MCV: 94.1 fL (ref 80.0–100.0)
Monocytes Absolute: 0.3 10*3/uL (ref 0.1–1.0)
Monocytes Relative: 8 %
Neutro Abs: 1.6 10*3/uL — ABNORMAL LOW (ref 1.7–7.7)
Neutrophils Relative %: 41 %
Platelets: 268 10*3/uL (ref 150–400)
RBC: 4.04 MIL/uL (ref 3.87–5.11)
RDW: 13.1 % (ref 11.5–15.5)
WBC: 3.9 10*3/uL — ABNORMAL LOW (ref 4.0–10.5)
nRBC: 0 % (ref 0.0–0.2)

## 2021-06-12 LAB — COMPREHENSIVE METABOLIC PANEL
ALT: 8 U/L (ref 0–44)
AST: 11 U/L — ABNORMAL LOW (ref 15–41)
Albumin: 3.8 g/dL (ref 3.5–5.0)
Alkaline Phosphatase: 45 U/L (ref 38–126)
Anion gap: 6 (ref 5–15)
BUN: 8 mg/dL (ref 6–20)
CO2: 26 mmol/L (ref 22–32)
Calcium: 9.2 mg/dL (ref 8.9–10.3)
Chloride: 107 mmol/L (ref 98–111)
Creatinine, Ser: 0.93 mg/dL (ref 0.44–1.00)
GFR, Estimated: >60 mL/min (ref >60)
Glucose, Bld: 92 mg/dL (ref 70–99)
Potassium: 4.4 mmol/L (ref 3.5–5.1)
Sodium: 139 mmol/L (ref 135–145)
Total Bilirubin: 0.8 mg/dL (ref 0.3–1.2)
Total Protein: 7 g/dL (ref 6.5–8.1)

## 2021-06-12 LAB — LIPASE, BLOOD: Lipase: 94 U/L — ABNORMAL HIGH (ref 11–51)

## 2021-06-12 MED ORDER — LIDOCAINE VISCOUS HCL 2 % MT SOLN
OROMUCOSAL | Status: AC
  Filled 2021-06-12: qty 15

## 2021-06-12 MED ORDER — SUCRALFATE 1 G PO TABS: 1.0000 g | ORAL_TABLET | Freq: Three times a day (TID) | ORAL | 0 refills | Status: AC

## 2021-06-12 MED ORDER — ALUM & MAG HYDROXIDE-SIMETH 200-200-20 MG/5ML PO SUSP
ORAL | Status: AC
  Filled 2021-06-12: qty 30

## 2021-06-12 MED ORDER — PANTOPRAZOLE SODIUM 40 MG PO TBEC: 40.0000 mg | DELAYED_RELEASE_TABLET | Freq: Every day | ORAL | 0 refills | Status: DC

## 2021-06-12 MED ORDER — ALUM & MAG HYDROXIDE-SIMETH 200-200-20 MG/5ML PO SUSP
30.0000 mL | Freq: Once | ORAL | Status: AC
  Administered 2021-06-12: 30 mL via ORAL

## 2021-06-12 MED ORDER — ONDANSETRON 4 MG PO TBDP
ORAL_TABLET | ORAL | Status: AC
  Filled 2021-06-12: qty 1

## 2021-06-12 MED ORDER — ONDANSETRON 4 MG PO TBDP: 4.0000 mg | ORAL_TABLET | Freq: Three times a day (TID) | ORAL | 0 refills | Status: AC | PRN

## 2021-06-12 MED ORDER — LIDOCAINE VISCOUS HCL 2 % MT SOLN
15.0000 mL | Freq: Once | OROMUCOSAL | Status: AC
  Administered 2021-06-12: 15 mL via ORAL

## 2021-06-12 MED ORDER — ONDANSETRON 4 MG PO TBDP
4.0000 mg | ORAL_TABLET | Freq: Once | ORAL | Status: AC
  Administered 2021-06-12: 4 mg via ORAL

## 2021-06-12 NOTE — Discharge Instructions (Signed)
I am concerned that you have something called gastritis versus an ulcer.  It is very important that you follow-up with GI as soon as possible.  Please call to schedule an appointment them immediately after leaving our clinic.  Start Protonix on an empty stomach (30 minutes before meal or 2 hours after).  Take Carafate before meals and before bed.  Use Zofran as needed for nausea or vomiting.  Eat a bland diet and avoid spicy/acidic/fatty foods.  Abstain from alcohol and do not take NSAIDs including aspirin, ibuprofen/Advil, naproxen/Aleve.  If you have any sudden worsening of symptoms including bloody or dark stools, nausea/vomiting interfering with oral intake, worsening pain you need to go to the emergency room immediately as we discussed.

## 2021-06-12 NOTE — ED Triage Notes (Signed)
Pt presents with c/o possible stomach ulcers or heart burns.  States within the past month she has been taking pepto bismol and states she has been waking up in the middle of he night having nausea and cold sweats. States she has felt clammy and having tenderness on the middle of her stomach.

## 2021-06-12 NOTE — ED Provider Notes (Signed)
[facility]    CSN: [id number] Arrival date & time: 06/12/21  0808      History   Chief Complaint Chief Complaint  Patient presents with   Gastroesophageal Reflux    HPI Anselm Pancoastrica Simmer is a 36 y.o. female.   Patient presents today with a month-long history of worsening upper abdominal pain.  She does have a history of GERD but states current symptoms are more extreme than previous episodes of this condition.  Symptoms are worse at night when she lays down and several hours after laying down she will have extreme fullness in her epigastrium with associated esophageal burning.  This will worsen to severe nausea and cold sweats but denies any vomiting.  She does report some darker stools but has been taking Pepto-Bismol daily for the past several weeks.  She is also tried omeprazole without any improvement of symptoms.  She is not currently followed by GI specialist.  Denies previous abdominal surgery.  She reports that symptoms are much more extreme when she lies flat and so has been sleeping sitting up in her bed prompting evaluation.  She denies any unusual food, recent travel, suspicious food intake.  She denies previous history of peptic ulcer disease but is concerned this might be causing her symptoms.  She does not take a GLP-1 agonist and denies history of pancreatitis.  She does not drink alcohol regularly and denies chronic NSAID use.   Past Medical History:  Diagnosis Date   Anemia    DVT (deep venous thrombosis) (HCC)    Pulmonary emboli (HCC)    Pulmonary embolism (HCC)     There are no problems to display for this patient.   Past Surgical History:  Procedure Laterality Date   CESAREAN SECTION      OB History   No obstetric history on file.      Home Medications    Prior to Admission medications   Medication Sig Start Date End Date Taking? Authorizing Provider  ondansetron (ZOFRAN-ODT) 4 MG disintegrating tablet Take 1 tablet (4 mg total) by mouth  every 8 (eight) hours as needed for nausea or vomiting. 06/12/21  Yes Paquita Printy K, PA-C  pantoprazole (PROTONIX) 40 MG tablet Take 1 tablet (40 mg total) by mouth daily. 06/12/21  Yes Cadee Agro K, PA-C  sucralfate (CARAFATE) 1 g tablet Take 1 tablet (1 g total) by mouth 4 (four) times daily -  with meals and at bedtime. 06/12/21  Yes Jermaine Neuharth K, PA-C  albuterol (VENTOLIN HFA) 108 (90 Base) MCG/ACT inhaler Inhale 2 puffs into the lungs every 4 (four) hours as needed. 01/19/20   [provider]  benzonatate (TESSALON) 100 MG capsule Take 1 capsule (100 mg total) by mouth every 8 (eight) hours. Patient not taking: Reported on 12/05/2020 10/01/20   Valinda HoarWhite, Adrienne R, NP  clindamycin (CLEOCIN) 300 MG capsule Take 1 capsule (300 mg total) by mouth 3 (three) times daily. Patient not taking: Reported on 03/25/2020 12/09/19   Wallis BambergMani, Mario, PA-C  famotidine (PEPCID) 20 MG tablet Take 20 mg by mouth at bedtime as needed. 12/13/19   [provider]  fluconazole (DIFLUCAN) 150 MG tablet Take 1 tablet (150 mg total) by mouth once a week. Patient not taking: Reported on 03/25/2020 12/09/19   Wallis BambergMani, Mario, PA-C  guaiFENesin-dextromethorphan [facility](ROBITUSSIN DM) 100-10 MG/5ML syrup Take 5 mLs by mouth every 4 (four) hours as needed for cough. Patient not taking: Reported on 12/05/2020 10/01/20   Valinda HoarWhite, Adrienne R, NP  Multiple Vitamin (MULTIVITAMIN WITH MINERALS) TABS tablet Take 1 tablet by mouth daily. Patient not taking: Reported on 03/25/2020    [provider]  naproxen (NAPROSYN) 500 MG tablet Take 1 tablet (500 mg total) by mouth 2 (two) times daily. 06/09/18   Sabas Sous, MD  ondansetron (ZOFRAN) 4 MG tablet Take 1 tablet (4 mg total) by mouth every 6 (six) hours. Patient not taking: Reported on 12/05/2020 10/01/20   Valinda Hoar, NP  traMADol (ULTRAM) 50 MG tablet Take 1 tablet (50 mg total) by mouth every 6 (six) hours as needed. Patient not taking: Reported on 03/25/2020 12/09/19    Wallis Bamberg, PA-C    Family History Family History  Problem Relation Age of Onset   Healthy Mother    Healthy Father     Social History Social History   Tobacco Use   Smoking status: Former   Smokeless tobacco: Never  Building services engineer Use: Every day  Substance Use Topics   Alcohol use: No   Drug use: No     Allergies   Latex and Penicillins   Review of Systems Review of Systems  Constitutional:  Positive for activity change and appetite change. Negative for fatigue and fever.  Respiratory:  Negative for cough and shortness of breath.   Cardiovascular:  Negative for chest pain.  Gastrointestinal:  Positive for abdominal pain and nausea. Negative for constipation, diarrhea and vomiting.  Neurological:  Negative for dizziness, light-headedness and headaches.    Physical Exam Triage Vital Signs ED Triage Vitals  Enc Vitals Group     BP 06/12/21 0848 116/74     Pulse Rate 06/12/21 0848 71     Resp 06/12/21 0848 18     Temp 06/12/21 0848 98.9 F (37.2 C)     Temp Source 06/12/21 0848 Oral     SpO2 06/12/21 0848 99 %     Weight --      Height --      Head Circumference --      Peak Flow --      Pain Score 06/12/21 0846 4     Pain Loc --      Pain Edu? --      Excl. in GC? --    No data found.  Updated Vital Signs BP 116/74 (BP Location: Right Arm)    Pulse 71    Temp 98.9 F (37.2 C) (Oral)    Resp 18    LMP 05/29/2021 (Exact Date)    SpO2 99%   Visual Acuity Right Eye Distance:   Left Eye Distance:   Bilateral Distance:    Right Eye Near:   Left Eye Near:    Bilateral Near:     Physical Exam Vitals reviewed.  Constitutional:      General: She is awake. She is not in acute distress.    Appearance: Normal appearance. She is well-developed. She is not ill-appearing.     Comments: Very pleasant female appears stated age sitting comfortably in exam room  HENT:     Head: Normocephalic and atraumatic.  Cardiovascular:     Rate and Rhythm: Normal  rate and regular rhythm.     Heart sounds: Normal heart sounds, S1 normal and S2 normal. No murmur heard. Pulmonary:     Effort: Pulmonary effort is normal.     Breath sounds: Normal breath sounds. No wheezing, rhonchi or rales.     Comments: Clear to auscultation bilaterally Abdominal:  General: Bowel sounds are normal.     Palpations: Abdomen is soft.     Tenderness: There is abdominal tenderness in the right upper quadrant, epigastric area and left upper quadrant. There is no right CVA tenderness, left CVA tenderness, guarding or rebound. Negative signs include Murphy's sign.     Comments: Patient had immediate recurrence of pain with lying flat.  Tenderness palpation throughout upper abdomen.  No evidence of acute abdomen on physical exam.  Psychiatric:        Behavior: Behavior is cooperative.     UC Treatments / Results  Labs (all labs ordered are listed, but only abnormal results are displayed) Labs Reviewed  CBC WITH DIFFERENTIAL/PLATELET - Abnormal; Notable for the following components:      Result Value   WBC 3.9 (*)    Neutro Abs 1.6 (*)    All other components within normal limits  COMPREHENSIVE METABOLIC PANEL  LIPASE, BLOOD    EKG   Radiology No results found.  Procedures Procedures (including critical care time)  Medications Ordered in UC Medications  ondansetron (ZOFRAN-ODT) disintegrating tablet 4 mg (4 mg Oral Given 06/12/21 0934)  alum & mag hydroxide-simeth (MAALOX/MYLANTA) 200-200-20 MG/5ML suspension 30 mL (30 mLs Oral Given 06/12/21 0934)    And  lidocaine (XYLOCAINE) 2 % viscous mouth solution 15 mL (15 mLs Oral Given 06/12/21 0935)    Initial Impression / Assessment and Plan / UC Course  I have reviewed the triage vital signs and the nursing notes.  Pertinent labs & imaging results that were available during my care of the patient were reviewed by me and considered in my medical decision making (see chart for details).     Vital signs and  physical exam reassuring today; no indication for emergent evaluation or imaging.  Patient is afebrile, well-appearing, nontoxic, nontachycardic.  She was given Zofran and GI cocktail in clinic with significant improvement of symptoms.  Concern for gastritis versus peptic ulcer disease as etiology of symptoms.  CBC, CMP, lipase obtained-results pending.  We will contact patient if this changes our treatment plan.  Discussed the importance of following up with a GI specialist and she was given contact information with instruction to call them to schedule appointment as soon as possible.  We will start Protonix, Zofran, Carafate for symptomatic management.  Discussed that if she has any worsening symptoms she needs to go to the emergency room since we do not have imaging capabilities in urgent care.  Alarm symptoms that warrant emergent evaluation were discussed including severe abdominal pain, blood in stool, dark stools, nausea/vomiting interfering with oral intake, generalized weakness.  Strict return precautions given to which she expressed understanding.  Work excuse note provided.  Final Clinical Impressions(s) / UC Diagnoses   Final diagnoses:  Abdominal pain, epigastric  Nausea without vomiting     Discharge Instructions      I am concerned that you have something called gastritis versus an ulcer.  It is very important that you follow-up with GI as soon as possible.  Please call to schedule an appointment them immediately after leaving our clinic.  Start Protonix on an empty stomach (30 minutes before meal or 2 hours after).  Take Carafate before meals and before bed.  Use Zofran as needed for nausea or vomiting.  Eat a bland diet and avoid spicy/acidic/fatty foods.  Abstain from alcohol and do not take NSAIDs including aspirin, ibuprofen/Advil, naproxen/Aleve.  If you have any sudden worsening of symptoms including bloody or dark  stools, nausea/vomiting interfering with oral intake, worsening  pain you need to go to the emergency room immediately as we discussed.     ED Prescriptions     Medication Sig Dispense Auth. Provider   ondansetron (ZOFRAN-ODT) 4 MG disintegrating tablet Take 1 tablet (4 mg total) by mouth every 8 (eight) hours as needed for nausea or vomiting. 20 tablet Loras Grieshop K, PA-C   pantoprazole (PROTONIX) 40 MG tablet Take 1 tablet (40 mg total) by mouth daily. 30 tablet Rhonna Holster K, PA-C   sucralfate (CARAFATE) 1 g tablet Take 1 tablet (1 g total) by mouth 4 (four) times daily -  with meals and at bedtime. 28 tablet Orlanda Frankum, Noberto RetortErin K, PA-C      PDMP not reviewed this encounter.   Jeani HawkingRaspet, Pancho Rushing K, PA-C 06/12/21 1029

## 2021-06-14 ENCOUNTER — Other Ambulatory Visit (INDEPENDENT_AMBULATORY_CARE_PROVIDER_SITE_OTHER): Payer: BC Managed Care – PPO

## 2021-06-14 ENCOUNTER — Encounter: Payer: Self-pay | Admitting: Gastroenterology

## 2021-06-14 ENCOUNTER — Ambulatory Visit (INDEPENDENT_AMBULATORY_CARE_PROVIDER_SITE_OTHER): Payer: BC Managed Care – PPO | Admitting: Gastroenterology

## 2021-06-14 ENCOUNTER — Telehealth: Payer: Self-pay | Admitting: Gastroenterology

## 2021-06-14 VITALS — BP 100/78 | HR 96 | Ht 62.0 in | Wt 155.0 lb

## 2021-06-14 DIAGNOSIS — K219 Gastro-esophageal reflux disease without esophagitis: Secondary | ICD-10-CM

## 2021-06-14 DIAGNOSIS — R748 Abnormal levels of other serum enzymes: Secondary | ICD-10-CM | POA: Diagnosis not present

## 2021-06-14 DIAGNOSIS — R109 Unspecified abdominal pain: Secondary | ICD-10-CM

## 2021-06-14 DIAGNOSIS — K5900 Constipation, unspecified: Secondary | ICD-10-CM

## 2021-06-14 DIAGNOSIS — R1013 Epigastric pain: Secondary | ICD-10-CM

## 2021-06-14 LAB — LIPASE: Lipase: 75 U/L — ABNORMAL HIGH (ref 11.0–59.0)

## 2021-06-14 NOTE — Patient Instructions (Addendum)
If you are age 35 or older, your body mass index should be between 23-30. Your Body mass index is 28.35 kg/m. If this is out of the aforementioned range listed, please consider follow up with your Primary Care Provider.  If you are age 87 or younger, your body mass index should be between 19-25. Your Body mass index is 28.35 kg/m. If this is out of the aformentioned range listed, please consider follow up with your Primary Care Provider.   You will be contacted by [facility] in the next 2 days to arrange a CT abdomen and Pelvis  .  The number on your caller ID will be [phone], please answer when they call.  If you have not heard from them in 2 days please call [phone] to schedule.    Your provider has requested that you go to the basement level for lab work before leaving today. Press "B" on the elevator. The lab is located at the first door on the left as you exit the elevator.  Start Miralax daily.   The [facility] providers would like to encourage you to use [facility] to communicate with providers for non-urgent requests or questions.  Due to long hold times on the telephone, sending your provider a message by [facility] may be a faster and more efficient way to get a response.  Please allow 48 business hours for a response.  Please remember that this is for non-urgent requests.   It was a pleasure to see you today!  Thank you for trusting me with your gastrointestinal care!    Scott E. Tomasa Rand, MD

## 2021-06-14 NOTE — Progress Notes (Signed)
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  She states that the week after Christmas, she started having very bothersome epigastric pain and nausea.  Symptoms were intermittent but were most noticeable and reliable at night.  In the past 2-3 weeks she has woken up between 1-3 AM with these symptoms.  The pain is described as burning in quality and she frequently feels bloated and distended.  She has chest pain as well which is not burning in quality.  These symptoms are improved with head of bed elevation, and she has actually been sleeping in a chair recently because her nocturnal symptoms are so bad.  At Urgent Care, she was prescribed Protonix, Carafate and Zofran.  The zofran helps her sleep.  She hasn't noticed a whole of improvement otherwise, although it has only been 2 days.  Previously she had been taking OTC Nexium and Pepto Bismol but didn't experience much improvement with those medications either.    She is also bothered by constipation.  She reports this as a long standing problem, but which has also worsened in the past few weeks.  Her constipation manifests as small hard stools which are difficult to pass.  She has bowel movements most days.  Recently she has been using Fleets enemas twice a day to produce stool.  She has previously used magnesium citrate which works well.  She also tried a dose of her mother's Linzess but didn't notice much effect.   She denies any change in her diet or medications that have been associated with the change in symptoms.  Her weight fluctuates, but she has not had any sustained weight loss or gain.  She does report a lot of stress at work She is a nondrinker and she quit smoking in 2011.  In Urgent Care she was noted to have a lipase of 95 (ULN 51).  CBC and CMP unremarkable.  Past Medical History:  Diagnosis Date   Anemia    DVT (deep  venous thrombosis) (HCC)    Frequent UTI    GERD (gastroesophageal reflux disease)    Pulmonary emboli (HCC)    Pulmonary embolism (HCC)     Past Surgical History:  Procedure Laterality Date   CESAREAN SECTION     Family History  Adopted: Yes   Social History   Tobacco Use   Smoking status: Former    Types: Cigarettes    Quit date: 2011    Years since quitting: 12.0   Smokeless tobacco: Never  Vaping Use   Vaping Use: Some days  Substance Use Topics   Alcohol use: No   Drug use: No   Current Outpatient Medications  Medication Sig Dispense Refill   albuterol (VENTOLIN HFA) 108 (90 Base) MCG/ACT inhaler Inhale 2 puffs into the lungs every 4 (four) hours as needed.     Multiple Vitamin (MULTIVITAMIN WITH MINERALS) TABS tablet Take 1 tablet by mouth daily.     naproxen (NAPROSYN) 500 MG tablet Take 1 tablet (500 mg total) by mouth 2 (two) times daily. 30 tablet 0   ondansetron (ZOFRAN) 4 MG tablet Take 1 tablet (4 mg total) by mouth every 6 (six) hours. 12 tablet 0   ondansetron (ZOFRAN-ODT) 4 MG disintegrating tablet Take 1 tablet (4 mg total) by mouth every 8 (eight) hours as needed for nausea or vomiting. 20 tablet 0  pantoprazole (PROTONIX) 40 MG tablet Take 1 tablet (40 mg total) by mouth daily. 30 tablet 0   sucralfate (CARAFATE) 1 g tablet Take 1 tablet (1 g total) by mouth 4 (four) times daily -  with meals and at bedtime. 28 tablet 0   No current facility-administered medications for this visit.   Allergies  Allergen Reactions   Latex Itching and Rash   Penicillins Rash and Other (See Comments)    Did it involve swelling of the face/tongue/throat, SOB, or low BP? yes Did it involve sudden or severe rash/hives, skin peeling, or any reaction on the inside of your mouth or nose? yes Did you need to seek medical attention at a hospital or doctor's office? yes When did it last happen? 2019 If all above answers are "NO", may proceed with cephalosporin use. UTI       Review of Systems: All systems reviewed and negative except where noted in HPI.    No results found.  Physical Exam: BP 100/78 (BP Location: Left Arm, Patient Position: Sitting, Cuff Size: Normal)    Pulse 96    Ht 5\' 2"  (1.575 m)    Wt 155 lb (70.3 kg)    LMP 05/29/2021 (Exact Date)    BMI 28.35 kg/m  Constitutional: Pleasant,well-developed, African American female in no acute distress. HEENT: Normocephalic and atraumatic. Conjunctivae are normal. No scleral icterus. Cardiovascular: Normal rate, regular rhythm.  Pulmonary/chest: Effort normal and breath sounds normal. No wheezing, rales or rhonchi. Abdominal: Soft, nondistended, tenderness to light palpation in the epigastrium without rigidity or guarding. Bowel sounds active throughout. There are no masses palpable. No hepatomegaly. Extremities: no edema Neurological: Alert and oriented to person place and time. Skin: Skin is warm and dry. No rashes noted. Psychiatric: Normal mood and affect. Behavior is normal.  CBC    Component Value Date/Time   WBC 3.9 (L) 06/12/2021 0908   RBC 4.04 06/12/2021 0908   HGB 12.2 06/12/2021 0908   HCT 38.0 06/12/2021 0908   PLT 268 06/12/2021 0908   MCV 94.1 06/12/2021 0908   MCH 30.2 06/12/2021 0908   MCHC 32.1 06/12/2021 0908   RDW 13.1 06/12/2021 0908   LYMPHSABS 1.9 06/12/2021 0908   MONOABS 0.3 06/12/2021 0908   EOSABS 0.0 06/12/2021 0908   BASOSABS 0.0 06/12/2021 0908    CMP     Component Value Date/Time   NA 139 06/12/2021 0908   K 4.4 06/12/2021 0908   CL 107 06/12/2021 0908   CO2 26 06/12/2021 0908   GLUCOSE 92 06/12/2021 0908   BUN 8 06/12/2021 0908   CREATININE 0.93 06/12/2021 0908   CALCIUM 9.2 06/12/2021 0908   PROT 7.0 06/12/2021 0908   ALBUMIN 3.8 06/12/2021 0908   AST 11 (L) 06/12/2021 0908   ALT 8 06/12/2021 0908   ALKPHOS 45 06/12/2021 0908   BILITOT 0.8 06/12/2021 0908   GFRNONAA >60 06/12/2021 0908   GFRAA >60 06/09/2018 0744   Component Ref Range  & Units 6 d ago 6 yr ago  Lipase 11 - 51 U/L 94 High   52 High     ASSESSMENT AND PLAN: 36 year old female with chronic epigastric pain, nausea, chest pain and constipation with bloating and distention, found to have elevated lipase (<2x ULN), symptoms not improved with PPI, although she has only been on prescription strength PPI for 2 days.  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  We discussed the pathophysiology  of GERD and the principles of GERD management to include lifestyle modifications  such as dietary discretion (avoidance of alcohol, tobacco, caffeinated and carbonated beverages, spicy/greasy foods, citrus, peppermint/chocolate), weight loss if applicable, head of bed elevation andconsuming last meal of day within 3 hours of bedtime; pharmacologic options to include PPIs, H2RAs and OTC antacids; and finally surgical or endoscopic fundoplication. I recommended that she increase the Protonix to twice a day, continue carafate and zofran.   Given her elevated lipase and localized pain to the epigastrium, will get CT to ensure there is no evidence of pancreatic inflammation.  Will check H. Pylori antigen and repeat lipase to see interval change. 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  She has not tried taking a daily bulk or osmotic laxative.  I recommended she start by taking either Metamucil or Miralax on a daily basis.  She can continue to take magnesium citrate as needed when no satisfactory bowel movement in 3 days.  Epigastric pain, elevated lipase - CT abd/pelvis - Recheck lipase - H pylori stool antigen  Nocturnal chest pain/nausea, likely GERD - Increase Protonix to BID - Continue Carafate, zofran for now - EGD if no improvement in symptoms   Constipation/bloating/distention - Daily Miralax vs Metamucil - PRN mag citrate  Catherine Torres E. Tomasa Rand, MD [facility]  No ref. provider found

## 2021-06-14 NOTE — Telephone Encounter (Signed)
Called patient and let her know that she could come pick up on 06/20/21 when she gets her CT scan.

## 2021-06-14 NOTE — Telephone Encounter (Signed)
Patient called and stated that she just left from her appointment with Dr. Candis Schatz and  did not receive the kit for her stool sample. Seeking advice, please advise.

## 2021-06-18 ENCOUNTER — Encounter: Payer: Self-pay | Admitting: Gastroenterology

## 2021-06-19 ENCOUNTER — Telehealth: Payer: Self-pay | Admitting: Gastroenterology

## 2021-06-19 NOTE — Telephone Encounter (Signed)
Patient called states her CT appt was canceled per Radiology dept due to her date of birth. They are telling her we have her D.O.B as 2004 and I advised her we have 1987 please call to clarify and get CT back on schedule.

## 2021-06-19 NOTE — Telephone Encounter (Signed)
Returned patients call. Patient realized that the insurance company has the wrong birth year of 2004 and is working on having that changed.

## 2021-06-20 ENCOUNTER — Other Ambulatory Visit: Payer: Self-pay

## 2021-06-20 ENCOUNTER — Ambulatory Visit (HOSPITAL_COMMUNITY): Payer: BC Managed Care – PPO

## 2021-06-20 ENCOUNTER — Encounter (HOSPITAL_COMMUNITY): Payer: Self-pay

## 2021-06-20 DIAGNOSIS — R1084 Generalized abdominal pain: Secondary | ICD-10-CM

## 2021-06-20 NOTE — Progress Notes (Signed)
Catherine Torres,  Your lipase was still slightly elevated but improved from prior.  Although a CT scan is much better at looking at the pancreas, it looks like insurance is requiring an [place] first.  If the [place] doesn't give [place] a good look at the pancreas, we will get the CT.

## 2021-06-20 NOTE — Telephone Encounter (Signed)
Spoke with [employer] company about the need for the CT scan changing because of elevated lipase they still wanted a recent Ultrasound. Patient has been informed that Ultrasound was scheduled for 06/28/21 @ 7am.

## 2021-06-20 NOTE — Telephone Encounter (Signed)
Inbound call from patient, stated she would like to speak with you about CT. Please advise.

## 2021-06-20 NOTE — Telephone Encounter (Signed)
Returned patient's call and got her rescheduled for her CT scan 06/21/21 @ 4:30pm. Patient had no questions at the end of call.

## 2021-06-20 NOTE — Telephone Encounter (Signed)
Patient is calling back and she requested to speak with you again.

## 2021-06-21 ENCOUNTER — Ambulatory Visit (HOSPITAL_COMMUNITY): Payer: BC Managed Care – PPO

## 2021-06-21 ENCOUNTER — Encounter (HOSPITAL_COMMUNITY): Payer: Self-pay

## 2021-06-28 ENCOUNTER — Other Ambulatory Visit: Payer: Self-pay

## 2021-06-28 ENCOUNTER — Ambulatory Visit (HOSPITAL_COMMUNITY)
Admission: RE | Admit: 2021-06-28 | Discharge: 2021-06-28 | Disposition: A | Discharge status: Home / Self Care | Payer: BC Managed Care – PPO | Source: Ambulatory Visit | Attending: Gastroenterology | Admitting: Gastroenterology

## 2021-06-28 DIAGNOSIS — R1084 Generalized abdominal pain: Secondary | ICD-10-CM | POA: Diagnosis not present

## 2021-06-28 DIAGNOSIS — R109 Unspecified abdominal pain: Secondary | ICD-10-CM | POA: Diagnosis not present

## 2021-07-02 NOTE — Progress Notes (Signed)
Catherine Torres,  Your ultrasound was normal.  The pancreas appeared normal without inflammatory changes, and there were no gallstones or abnormalities of the bile ducts.   If your symptoms are not improving, I would recommend we do an upper endoscopy (EGD).   If your symptoms are getting better, I would continue your current medications for another month and reassess.  Please update [place] with how your symptoms are doing.

## 2022-06-16 ENCOUNTER — Emergency Department (HOSPITAL_BASED_OUTPATIENT_CLINIC_OR_DEPARTMENT_OTHER): Payer: Medicaid Other

## 2022-06-16 ENCOUNTER — Emergency Department (HOSPITAL_BASED_OUTPATIENT_CLINIC_OR_DEPARTMENT_OTHER)
Admission: EM | Admit: 2022-06-16 | Discharge: 2022-06-16 | Disposition: A | Discharge status: Home / Self Care | Payer: Medicaid Other | Attending: Emergency Medicine | Admitting: Emergency Medicine

## 2022-06-16 ENCOUNTER — Other Ambulatory Visit: Payer: Self-pay

## 2022-06-16 ENCOUNTER — Encounter (HOSPITAL_BASED_OUTPATIENT_CLINIC_OR_DEPARTMENT_OTHER): Payer: Self-pay

## 2022-06-16 DIAGNOSIS — Z91040 Latex allergy status: Secondary | ICD-10-CM | POA: Diagnosis not present

## 2022-06-16 DIAGNOSIS — R1013 Epigastric pain: Secondary | ICD-10-CM | POA: Diagnosis present

## 2022-06-16 DIAGNOSIS — R079 Chest pain, unspecified: Secondary | ICD-10-CM | POA: Insufficient documentation

## 2022-06-16 DIAGNOSIS — R1011 Right upper quadrant pain: Secondary | ICD-10-CM | POA: Diagnosis not present

## 2022-06-16 DIAGNOSIS — K219 Gastro-esophageal reflux disease without esophagitis: Secondary | ICD-10-CM | POA: Insufficient documentation

## 2022-06-16 DIAGNOSIS — R0789 Other chest pain: Secondary | ICD-10-CM | POA: Diagnosis not present

## 2022-06-16 DIAGNOSIS — R109 Unspecified abdominal pain: Secondary | ICD-10-CM | POA: Diagnosis not present

## 2022-06-16 DIAGNOSIS — R0600 Dyspnea, unspecified: Secondary | ICD-10-CM | POA: Diagnosis not present

## 2022-06-16 LAB — HEPATIC FUNCTION PANEL
ALT: 7 U/L (ref 0–44)
AST: 15 U/L (ref 15–41)
Albumin: 3.8 g/dL (ref 3.5–5.0)
Alkaline Phosphatase: 35 U/L — ABNORMAL LOW (ref 38–126)
Bilirubin, Direct: 0.1 mg/dL (ref 0.0–0.2)
Indirect Bilirubin: 0.3 mg/dL (ref 0.3–0.9)
Total Bilirubin: 0.4 mg/dL (ref 0.3–1.2)
Total Protein: 7.1 g/dL (ref 6.5–8.1)

## 2022-06-16 LAB — CBC
HCT: 35.2 % — ABNORMAL LOW (ref 36.0–46.0)
Hemoglobin: 11.5 g/dL — ABNORMAL LOW (ref 12.0–15.0)
MCH: 30.1 pg (ref 26.0–34.0)
MCHC: 32.7 g/dL (ref 30.0–36.0)
MCV: 92.1 fL (ref 80.0–100.0)
Platelets: 272 10*3/uL (ref 150–400)
RBC: 3.82 MIL/uL — ABNORMAL LOW (ref 3.87–5.11)
RDW: 13.2 % (ref 11.5–15.5)
WBC: 4.4 10*3/uL (ref 4.0–10.5)
nRBC: 0 % (ref 0.0–0.2)

## 2022-06-16 LAB — BASIC METABOLIC PANEL
Anion gap: 8 (ref 5–15)
BUN: 13 mg/dL (ref 6–20)
CO2: 25 mmol/L (ref 22–32)
Calcium: 9.2 mg/dL (ref 8.9–10.3)
Chloride: 106 mmol/L (ref 98–111)
Creatinine, Ser: 0.71 mg/dL (ref 0.44–1.00)
GFR, Estimated: >60 mL/min (ref >60)
Glucose, Bld: 96 mg/dL (ref 70–99)
Potassium: 4.4 mmol/L (ref 3.5–5.1)
Sodium: 139 mmol/L (ref 135–145)

## 2022-06-16 LAB — LIPASE, BLOOD: Lipase: 55 U/L — ABNORMAL HIGH (ref 11–51)

## 2022-06-16 LAB — TROPONIN I (HIGH SENSITIVITY)
Troponin I (High Sensitivity): <2 ng/L (ref <18)
Troponin I (High Sensitivity): 2 ng/L (ref <18)

## 2022-06-16 LAB — PREGNANCY, URINE: Preg Test, Ur: NEGATIVE

## 2022-06-16 MED ORDER — ONDANSETRON 4 MG PO TBDP
4.0000 mg | ORAL_TABLET | Freq: Once | ORAL | Status: AC
  Administered 2022-06-16: 4 mg via ORAL
  Filled 2022-06-16: qty 1

## 2022-06-16 MED ORDER — ALUM & MAG HYDROXIDE-SIMETH 200-200-20 MG/5ML PO SUSP
30.0000 mL | Freq: Once | ORAL | Status: AC
  Administered 2022-06-16: 30 mL via ORAL
  Filled 2022-06-16: qty 30

## 2022-06-16 MED ORDER — IOHEXOL 350 MG/ML SOLN
100.0000 mL | Freq: Once | INTRAVENOUS | Status: AC | PRN
  Administered 2022-06-16: 75 mL via INTRAVENOUS

## 2022-06-16 MED ORDER — LIDOCAINE VISCOUS HCL 2 % MT SOLN
15.0000 mL | Freq: Once | OROMUCOSAL | Status: AC
  Administered 2022-06-16: 15 mL via ORAL
  Filled 2022-06-16: qty 15

## 2022-06-16 MED ORDER — PANTOPRAZOLE SODIUM 40 MG PO TBEC: 40.0000 mg | DELAYED_RELEASE_TABLET | Freq: Every day | ORAL | 1 refills | Status: AC

## 2022-06-16 NOTE — ED Provider Notes (Signed)
[facility] Provider Note   CSN: [id number] Arrival date & time: 06/16/22  1030     History  Chief Complaint  Patient presents with   Abdominal Pain   Gastroesophageal Reflux   Chest Pain    Catherine Torres is a 37 y.o. female with PMH GERD, DVT and PE in 2013, frequent UTIs presenting for 5 days of epigastric abdominal pain and 3 days of chest pain.  She says her abdominal pain started getting worse towards the end of last week.  She describes the pain as bloating and sharp belly pain with associated nausea.  She has been taking Zofran and has not vomited.  She has not tried anything else for the belly pain.  It is not associated with mealtimes or specific times of day, though she did state that she started sleeping in recliner because it is improves her belly pain.  She also describes foul-smelling stools over the past few days, though has not noticed any dark or light stools.  She also started having sharp left-sided chest pain 3 days ago with associated dyspnea.  She says that her pain occasionally takes her breath away.  She denies any hemoptysis, cough, hematemesis, fevers or chills.  She says she was previously taking Protonix for GERD, but stopped a long time ago.  She has never previously had an endoscopy.  She does not currently see a doctor regularly and does not take any prescribed medications.    Home Medications Prior to Admission medications   Medication Sig Start Date End Date Taking? Authorizing Provider  pantoprazole (PROTONIX) 40 MG tablet Take 1 tablet (40 mg total) by mouth daily. 06/16/22 06/16/23 Yes Linus Galas, MD  albuterol (VENTOLIN HFA) 108 (90 Base) MCG/ACT inhaler Inhale 2 puffs into the lungs every 4 (four) hours as needed. 01/19/20   [provider]  Multiple Vitamin (MULTIVITAMIN WITH MINERALS) TABS tablet Take 1 tablet by mouth daily.    [provider]  naproxen (NAPROSYN) 500 MG tablet Take 1  tablet (500 mg total) by mouth 2 (two) times daily. 06/09/18   Maudie Flakes, MD  ondansetron (ZOFRAN) 4 MG tablet Take 1 tablet (4 mg total) by mouth every 6 (six) hours. 10/01/20   White, Leitha Schuller, NP  ondansetron (ZOFRAN-ODT) 4 MG disintegrating tablet Take 1 tablet (4 mg total) by mouth every 8 (eight) hours as needed for nausea or vomiting. 06/12/21   Raspet, Derry Skill, PA-C  sucralfate (CARAFATE) 1 g tablet Take 1 tablet (1 g total) by mouth 4 (four) times daily -  with meals and at bedtime. 06/12/21   Raspet, Derry Skill, PA-C      Allergies    Latex and Penicillins    Review of Systems   See HPI  Physical Exam Updated Vital Signs BP 119/83 (BP Location: Right Arm)   Pulse 80   Temp 98.8 F (37.1 C) (Oral)   Resp 17   Ht 5\' 3"  (1.6 m)   Wt 72.6 kg   LMP 06/16/2022   SpO2 100%   BMI 28.34 kg/m  Physical Exam Constitutional:      General: She is not in acute distress. HENT:     Head: Normocephalic and atraumatic.  Eyes:     General: No scleral icterus.    Extraocular Movements: Extraocular movements intact.     Pupils: Pupils are equal, round, and reactive to light.  Cardiovascular:     Rate and Rhythm: Normal rate and regular  rhythm.     Heart sounds: Normal heart sounds.  Pulmonary:     Effort: Pulmonary effort is normal. No respiratory distress.     Breath sounds: Normal breath sounds.  Abdominal:     General: Bowel sounds are decreased. There is no distension.     Palpations: Abdomen is soft.     Tenderness: There is abdominal tenderness in the right upper quadrant. There is guarding. There is no rebound. Positive signs include Murphy's sign.  Skin:    General: Skin is warm and dry.     Coloration: Skin is not jaundiced.     Findings: No rash.  Neurological:     Mental Status: She is alert.     ED Results / Procedures / Treatments   Labs (all labs ordered are listed, but only abnormal results are displayed) Labs Reviewed  CBC - Abnormal; Notable for the  following components:      Result Value   RBC 3.82 (*)    Hemoglobin 11.5 (*)    HCT 35.2 (*)    All other components within normal limits  LIPASE, BLOOD - Abnormal; Notable for the following components:   Lipase 55 (*)    All other components within normal limits  HEPATIC FUNCTION PANEL - Abnormal; Notable for the following components:   Alkaline Phosphatase 35 (*)    All other components within normal limits  BASIC METABOLIC PANEL  PREGNANCY, URINE  TROPONIN I (HIGH SENSITIVITY)  TROPONIN I (HIGH SENSITIVITY)    EKG EKG Interpretation  Date/Time:  Monday June 16 2022 10:41:35 EST Ventricular Rate:  82 PR Interval:  156 QRS Duration: 72 QT Interval:  380 QTC Calculation: 443 R Axis:   83 Text Interpretation: Normal sinus rhythm Normal ECG When compared with ECG of 08-Aug-2020 09:02, PREVIOUS ECG IS PRESENT when compared to prior, similar appearance. No STEMI Confirmed by Antony Blackbird [phone]) on 06/16/2022 11:36:58 AM  Radiology US Abdomen Limited RUQ (LIVER/GB)  Result Date: 06/16/2022 CLINICAL DATA:  Right upper quadrant pain for 5 days. EXAM: ULTRASOUND ABDOMEN LIMITED RIGHT UPPER QUADRANT COMPARISON:  06/28/2021. FINDINGS: Gallbladder: No gallstones or wall thickening visualized. No sonographic Murphy sign noted by sonographer. Common bile duct: Diameter: 2 mm, within normal limits. No intrahepatic biliary ductal dilatation. Liver: No focal lesion identified. Within normal limits in parenchymal echogenicity. Portal vein is patent on color Doppler imaging with normal direction of blood flow towards the liver. Other: None. IMPRESSION: Normal exam. Electronically Signed   By: Lorin Picket M.D.   On: 06/16/2022 14:25   CT Angio Chest PE W and/or Wo Contrast  Result Date: 06/16/2022 CLINICAL DATA:  Dyspnea. Pleuritic chest pain for 3 days. Also epigastric and abdominal pain for 5 days. EXAM: CT ANGIOGRAPHY CHEST WITH CONTRAST TECHNIQUE: Multidetector CT imaging of the chest  was performed using the standard protocol during bolus administration of intravenous contrast. Multiplanar CT image reconstructions and MIPs were obtained to evaluate the vascular anatomy. RADIATION DOSE REDUCTION: This exam was performed according to the departmental dose-optimization program which includes automated exposure control, adjustment of the mA and/or kV according to patient size and/or use of iterative reconstruction technique. CONTRAST:  86mL OMNIPAQUE IOHEXOL 350 MG/ML SOLN COMPARISON:  08/08/2020. FINDINGS: Cardiovascular: Pulmonary arteries are well opacified. No evidence of a pulmonary embolism. Normal heart and great vessels.  No pericardial effusion. Mediastinum/Nodes: No neck base, mediastinal or hilar masses or enlarged lymph nodes. Trachea and esophagus are unremarkable. Lungs/Pleura: Lungs are clear. No pleural effusion or pneumothorax.  Upper Abdomen: Unremarkable. Musculoskeletal: No chest wall abnormality. No acute or significant osseous findings. Review of the MIP images confirms the above findings. IMPRESSION: 1. No evidence of a pulmonary embolism. 2. Normal exam. Electronically Signed   By: Lajean Manes M.D.   On: 06/16/2022 14:25    Medications Ordered in ED Medications  alum & mag hydroxide-simeth (MAALOX/MYLANTA) 200-200-20 MG/5ML suspension 30 mL (30 mLs Oral Given 06/16/22 1429)    And  lidocaine (XYLOCAINE) 2 % viscous mouth solution 15 mL (15 mLs Oral Given 06/16/22 1429)  iohexol (OMNIPAQUE) 350 MG/ML injection 100 mL (75 mLs Intravenous Contrast Given 06/16/22 1412)    ED Course/ Medical Decision Making/ A&P    Medical Decision Making Amount and/or Complexity of Data Reviewed Labs: ordered. Radiology: ordered.  Risk OTC drugs. Prescription drug management.   Corry Storie is a 36 y.o. female with PMH GERD, DVT and PE in 2013, frequent UTIs presenting for 5 days of epigastric abdominal pain and 3 days of chest pain.  Her abdominal pain sounds like dyspepsia  especially with history of GERD which is currently untreated.  She does however have right upper quadrant tenderness and Murphy positive on abdominal exam with recent onset of foul-smelling stools.  DDx includes GERD, H. pylori or PUD, cholecystitis, viral GI illness.  Will rule out options with initial lab workup and right upper quadrant ultrasound.  If these are negative, she can follow-up with GI in the outpatient setting for possible endoscopy.  Will also treat with GI cocktail while she is here.  Pleuritic chest pain, dyspnea concerning for recurrent PE as she is currently not anticoagulated.  ACS also a possibility.  Will rule out both of these and if negative treat symptomatically, could be associated with GI symptoms above.  Update: CT PE study negative, right upper quadrant study negative. ACS workup unremarkable, lipase and liver function panel unremarkable.  She is feeling better after GI cocktail.  Will go ahead and discharge on supply of Protonix 40 mg daily.  She can follow-up with GI in the outpatient setting for further workup if appropriate.  Final Clinical Impression(s) / ED Diagnoses Final diagnoses:  Dyspepsia  Gastroesophageal reflux disease, unspecified whether esophagitis present    Rx / DC Orders ED Discharge Orders          Ordered    pantoprazole (PROTONIX) 40 MG tablet  Daily        06/16/22 1505    Ambulatory referral to Gastroenterology       Comments: Dyspepsia, possible h pylori   06/16/22 1506              Linus Galas, MD 06/16/22 1508    Tegeler, Gwenyth Allegra, MD 06/17/22 1549

## 2022-06-16 NOTE — ED Notes (Signed)
Discharge instructions, follow up care, and prescription reviewed and explained, pt verbalized understanding and had no further questions on d/c. Pt caox4 and ambulatory on d/c.

## 2022-06-16 NOTE — ED Triage Notes (Signed)
Pt to ED c/o epigastric/abdominal pain x 5 days, hx GERD, also reports chest pain x 3 days, reports progressively getting worse.

## 2022-06-16 NOTE — Discharge Instructions (Signed)
Your imaging and lab workup was negative for anything going on with your heart or lungs or any gallbladder disease.  I think your symptoms are likely due to reflux or possible H. pylori infection.  Please start taking Protonix 40 mg daily and follow-up with gastroenterologist in the outpatient setting for further evaluation.  If you develop worsening shortness of breath, chest pain, nausea or vomiting please come back to the ED to be reevaluated.

## 2022-06-16 NOTE — ED Notes (Signed)
Asked pt if she could provide urine sample, pt states she is unable to do so at this time.

## 2022-08-19 DIAGNOSIS — R519 Headache, unspecified: Secondary | ICD-10-CM | POA: Diagnosis not present

## 2022-11-13 IMAGING — CT CT ANGIO CHEST
3 of 7 series · 17 of 36 positions shown · IV contrast (OMNIPAQUE 350)
Comparison: 06/09/2018

CLINICAL DATA: Elevated D-dimer, chest pain, shortness of breath,
history of pulmonary embolism, not currently anticoagulated

EXAM:
CT ANGIOGRAPHY CHEST WITH CONTRAST
TECHNIQUE: Multidetector CT imaging of the chest was performed using the
standard protocol during bolus administration of intravenous
contrast. Multiplanar CT image reconstructions and MIPs were
obtained to evaluate the vascular anatomy.
CONTRAST:  62mL OMNIPAQUE IOHEXOL 350 MG/ML SOLN IV

[Series 5: thins · axial · 0.73mm/px · z∈[+1455,+1690]mm · 12 of 279 slices shown]
[im 22/279  lung]
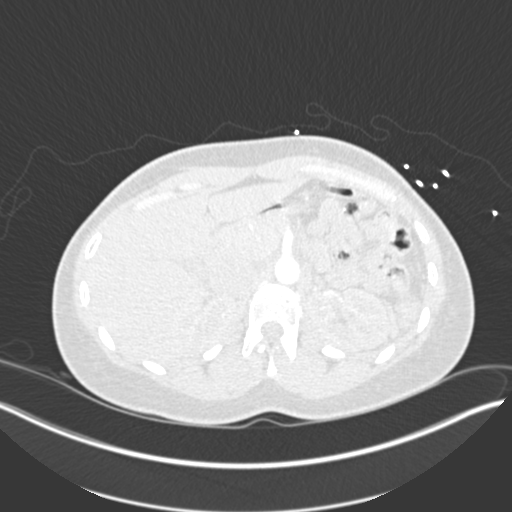
[im 43/279  mediastinal]
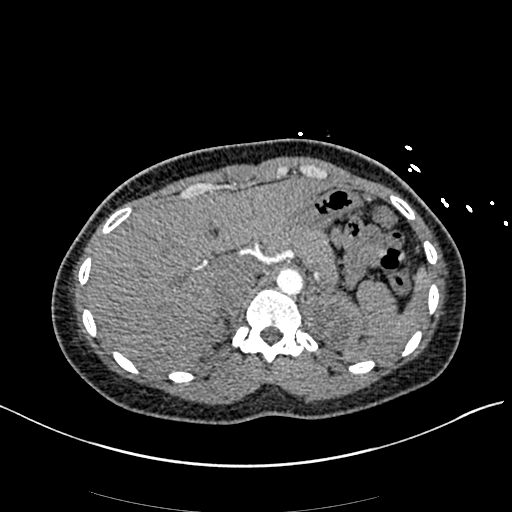
[im 65/279  lung]
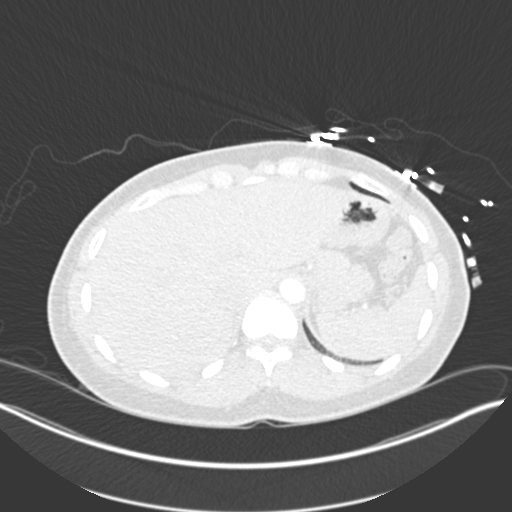
[im 86/279  mediastinal]
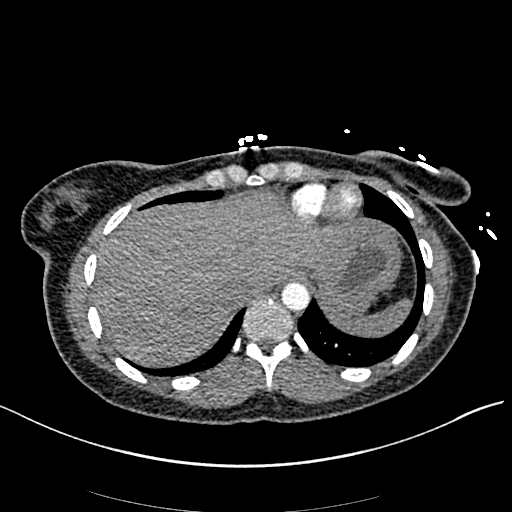
[im 107/279  lung]
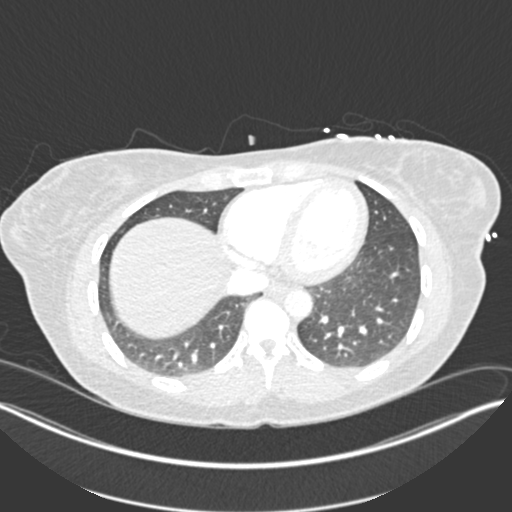
[im 129/279  mediastinal]
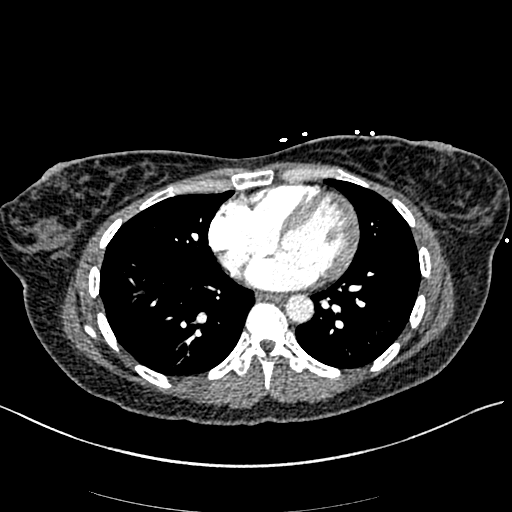
[im 150/279  lung]
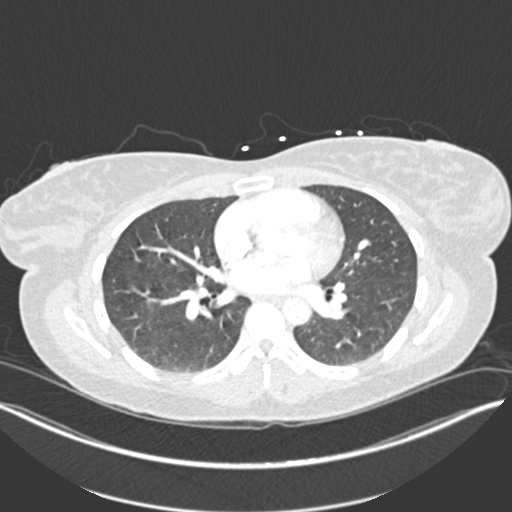
[im 172/279  mediastinal]
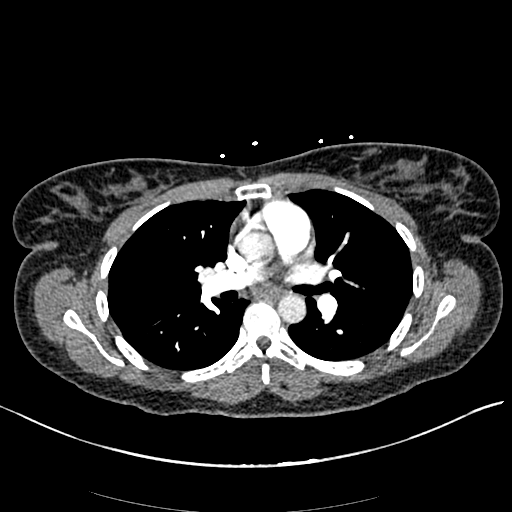
[im 193/279  lung]
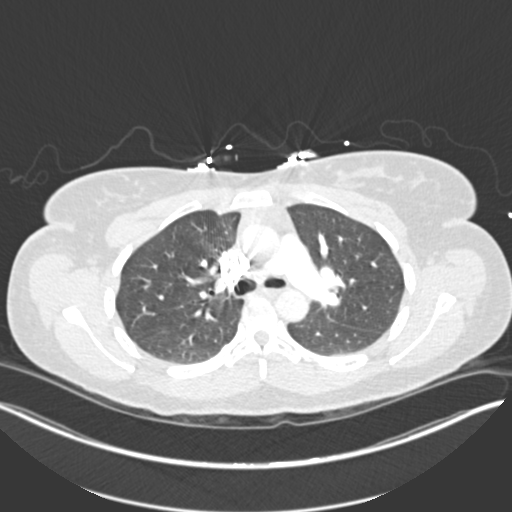
[im 214/279  mediastinal]
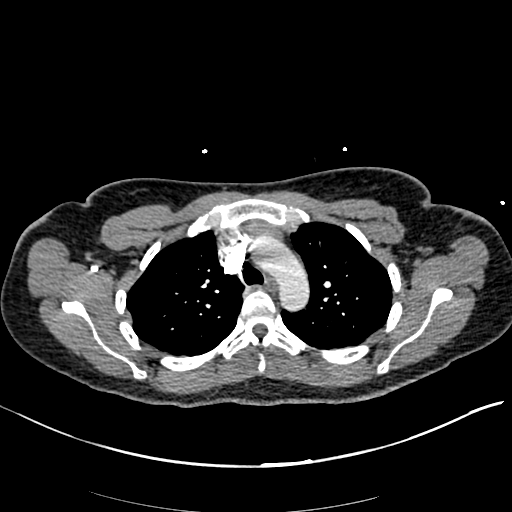
[im 236/279  lung]
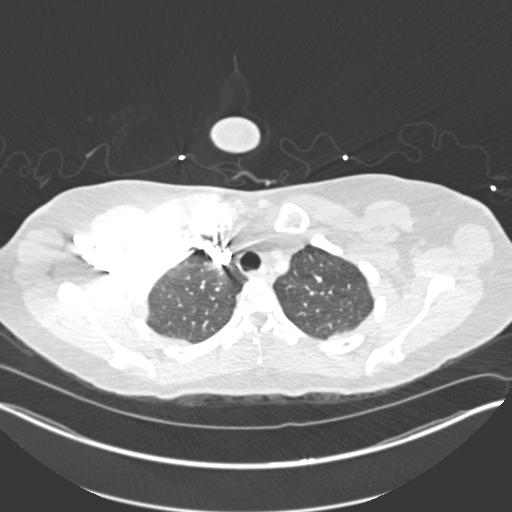
[im 257/279  mediastinal]
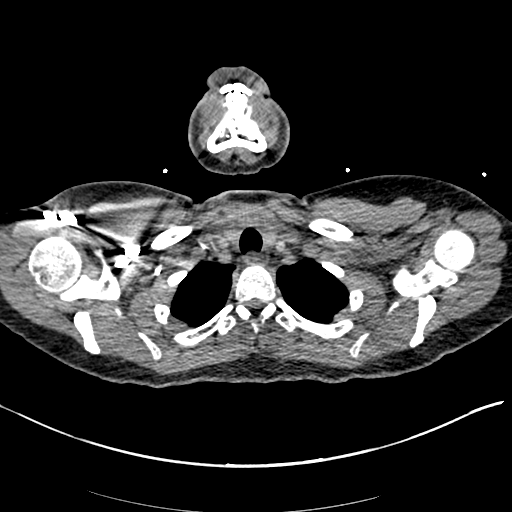

[Series 6: coronal mpr · coronal · 0.65mm/px · 1 of 97 slices shown]
[im 49/97  mediastinal]
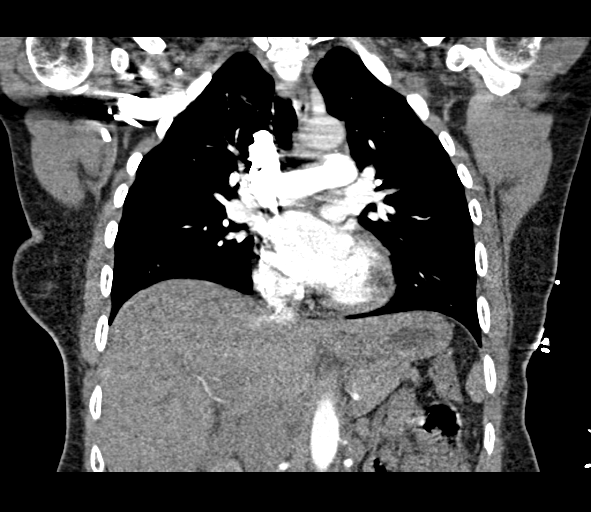

[Series 10: lung · axial · 0.73mm/px · z∈[+1514,+1662]mm · 4 of 124 slices shown]
[im 25/124  mediastinal]
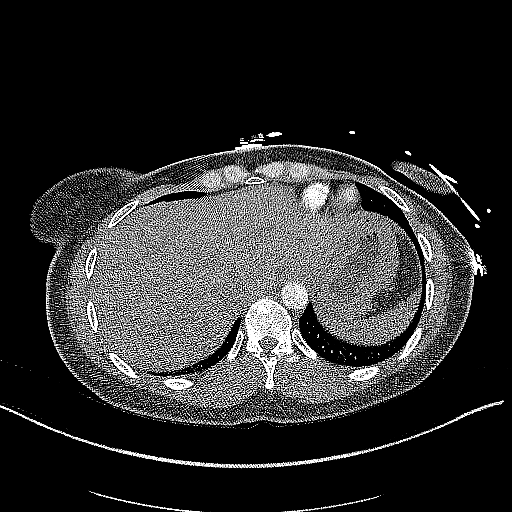
[im 50/124  mediastinal]
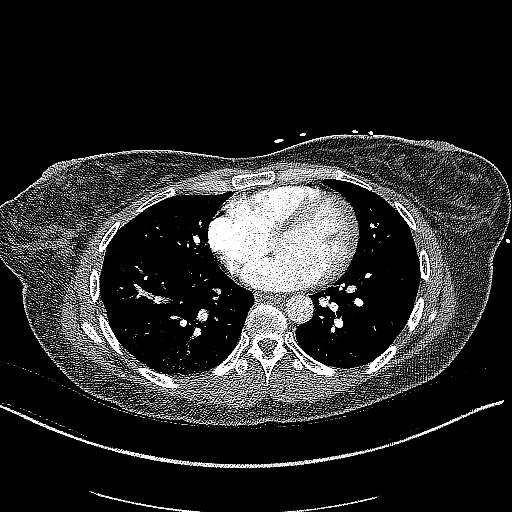
[im 74/124  mediastinal]
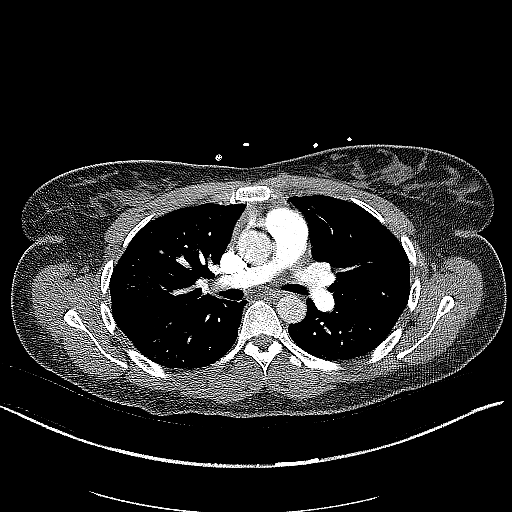
[im 99/124  mediastinal]
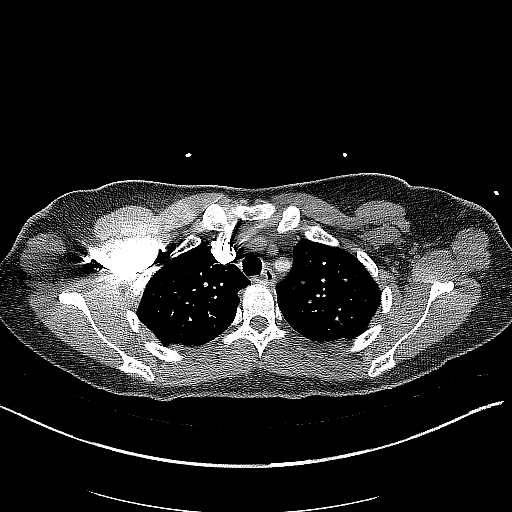

[17 of 36 positions shown; findings below may reference images not displayed]

FINDINGS: Cardiovascular: Aorta normal caliber without aneurysm or dissection.
Heart unremarkable. No pericardial effusion. Pulmonary arteries
adequately opacified and patent. No evidence of pulmonary embolism.

Mediastinum/Nodes: Esophagus unremarkable. Base of cervical region
normal appearance. No thoracic adenopathy.

Lungs/Pleura: Lungs clear. No pulmonary infiltrate, pleural
effusion, or pneumothorax.

Upper Abdomen: Unremarkable

Musculoskeletal: No acute osseous findings.

Review of the MIP images confirms the above findings.
IMPRESSION: No evidence of pulmonary embolism.

No acute intrathoracic process.

## 2022-12-04 ENCOUNTER — Telehealth: Payer: Self-pay | Admitting: Gastroenterology

## 2022-12-04 NOTE — Telephone Encounter (Signed)
Patient called requesting to speak with a nurse states she has issues with changes in her bowel movements.

## 2022-12-05 NOTE — Telephone Encounter (Signed)
Called patient in reference to call and complaints of the lasts 5-6 months. Patient states she has been having issues with her bowel movements and denies diarrhea, although she states she has had some loose stools. States she had "pebbles" of stools on Monday of this week, that were hard in texture, and on Tuesday states her stools were normal without any use of medications. The most concern patient is having is the smell. Patient states the smell is unlike anything she has ever experienced and she is worried about the possibility that something may be wrong. Please advise.

## 2022-12-09 NOTE — Telephone Encounter (Signed)
Called patient to make aware of Dr. Milas Hock response in relation to patient's concerns about stool odor. Patient denies consistent diarrhea and noted blood in stool. Patient understood and agreed.

## 2022-12-16 DIAGNOSIS — N898 Other specified noninflammatory disorders of vagina: Secondary | ICD-10-CM | POA: Diagnosis not present

## 2022-12-16 DIAGNOSIS — R300 Dysuria: Secondary | ICD-10-CM | POA: Diagnosis not present

## 2022-12-16 DIAGNOSIS — R319 Hematuria, unspecified: Secondary | ICD-10-CM | POA: Diagnosis not present

## 2023-02-10 DIAGNOSIS — N760 Acute vaginitis: Secondary | ICD-10-CM | POA: Diagnosis not present

## 2023-02-10 DIAGNOSIS — R319 Hematuria, unspecified: Secondary | ICD-10-CM | POA: Diagnosis not present

## 2023-02-10 DIAGNOSIS — K5900 Constipation, unspecified: Secondary | ICD-10-CM | POA: Diagnosis not present

## 2023-02-10 DIAGNOSIS — R140 Abdominal distension (gaseous): Secondary | ICD-10-CM | POA: Diagnosis not present

## 2023-02-10 DIAGNOSIS — B9689 Other specified bacterial agents as the cause of diseases classified elsewhere: Secondary | ICD-10-CM | POA: Diagnosis not present

## 2023-06-07 DIAGNOSIS — N898 Other specified noninflammatory disorders of vagina: Secondary | ICD-10-CM | POA: Diagnosis not present

## 2023-06-07 DIAGNOSIS — M545 Low back pain, unspecified: Secondary | ICD-10-CM | POA: Diagnosis not present

## 2023-06-07 DIAGNOSIS — M79605 Pain in left leg: Secondary | ICD-10-CM | POA: Diagnosis not present

## 2023-06-09 DIAGNOSIS — G43109 Migraine with aura, not intractable, without status migrainosus: Secondary | ICD-10-CM | POA: Diagnosis not present

## 2023-06-09 DIAGNOSIS — R21 Rash and other nonspecific skin eruption: Secondary | ICD-10-CM | POA: Diagnosis not present

## 2023-06-09 DIAGNOSIS — M79605 Pain in left leg: Secondary | ICD-10-CM | POA: Diagnosis not present

## 2023-06-09 DIAGNOSIS — M545 Low back pain, unspecified: Secondary | ICD-10-CM | POA: Diagnosis not present

## 2023-10-03 IMAGING — US US ABDOMEN COMPLETE
1 series · 15 of 25 positions shown · non-contrast
Comparison: CTA chest 08/08/2020.

CLINICAL DATA: 35-year-old female with abdominal pain and elevated
lipase.

EXAM:
ABDOMEN ULTRASOUND COMPLETE

[Series 1: us abdomen complete mc & wl · 15 of 78 slices shown]
[im 1/78]
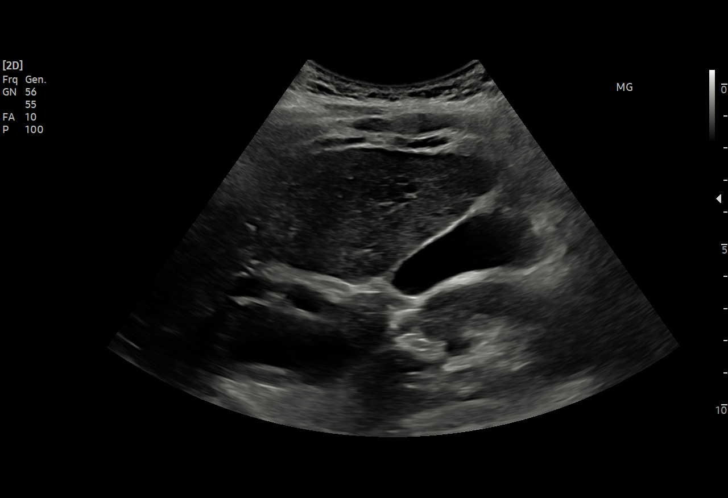
[im 7/78]
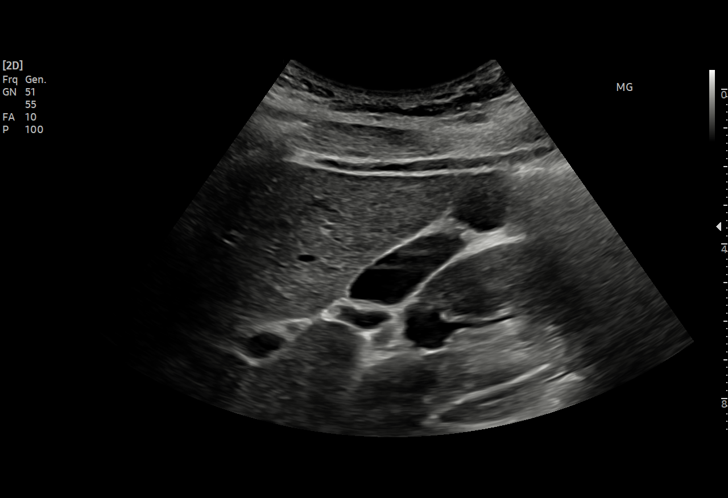
[im 13/78]
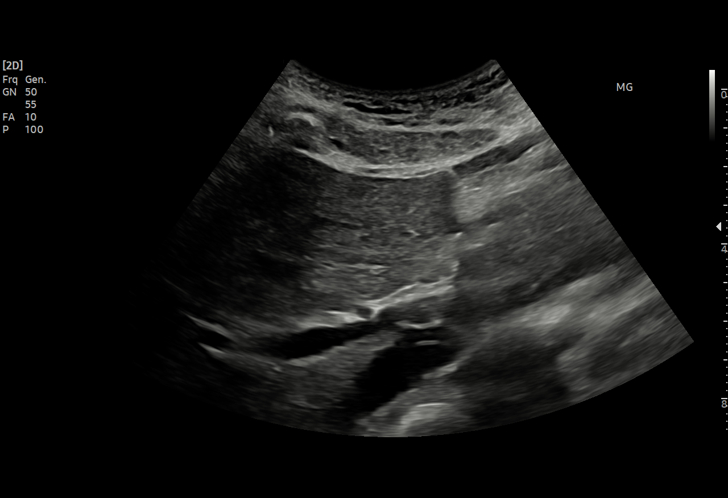
[im 17/78]
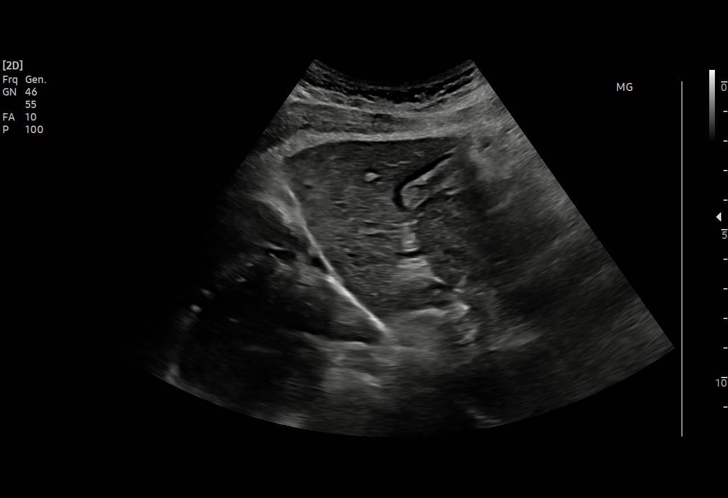
[im 23/78]
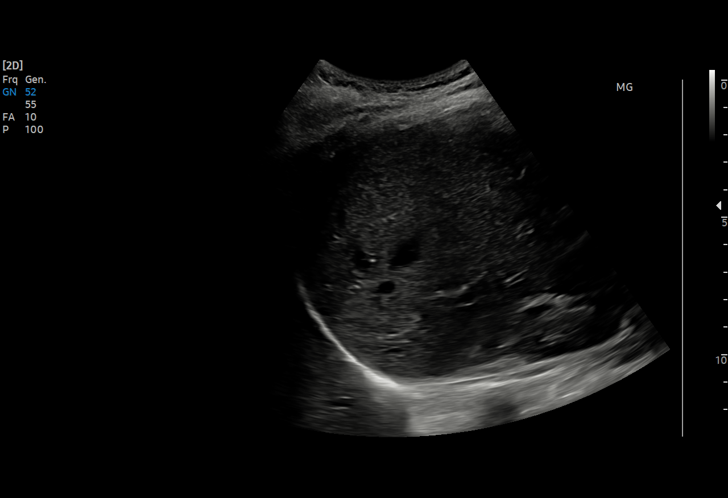
[im 29/78]
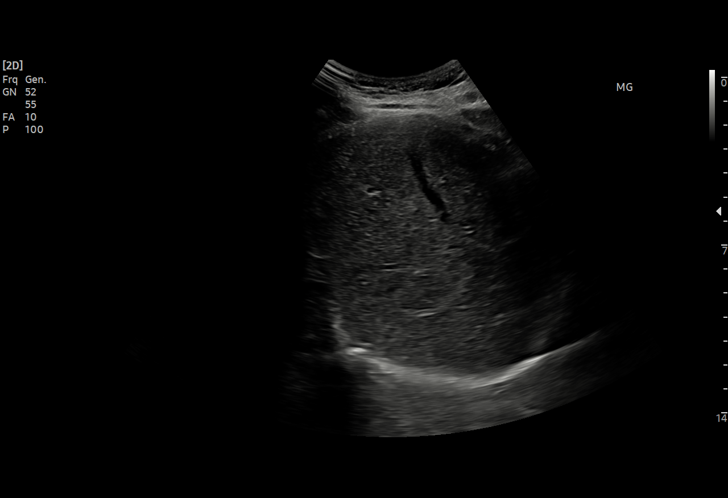
[im 33/78]
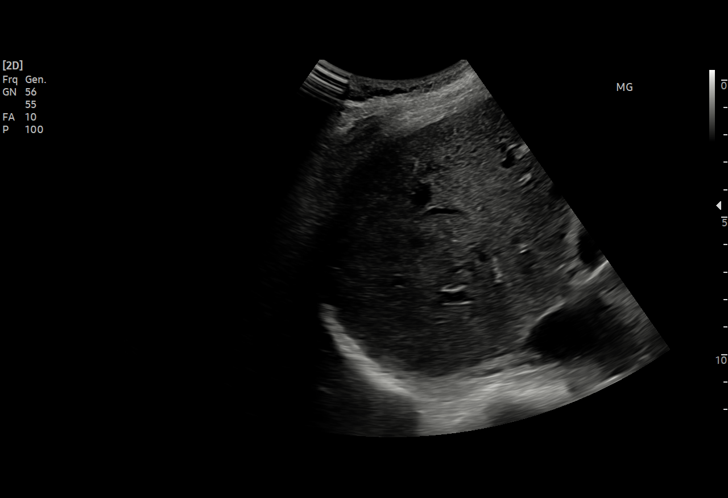
[im 39/78]
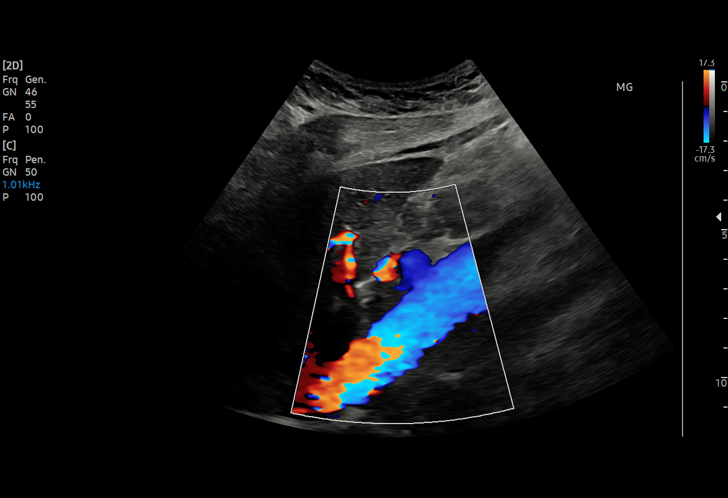
[im 45/78]
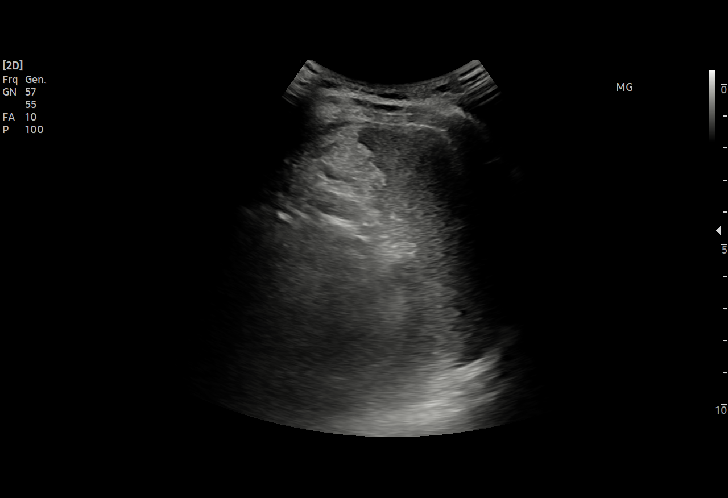
[im 49/78]
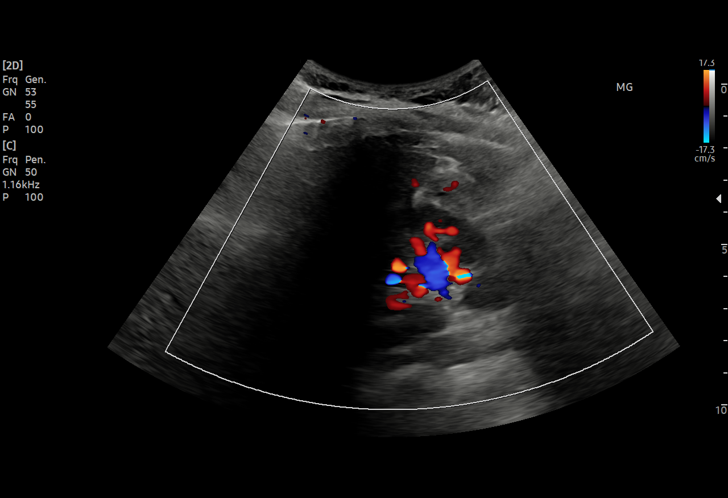
[im 55/78]
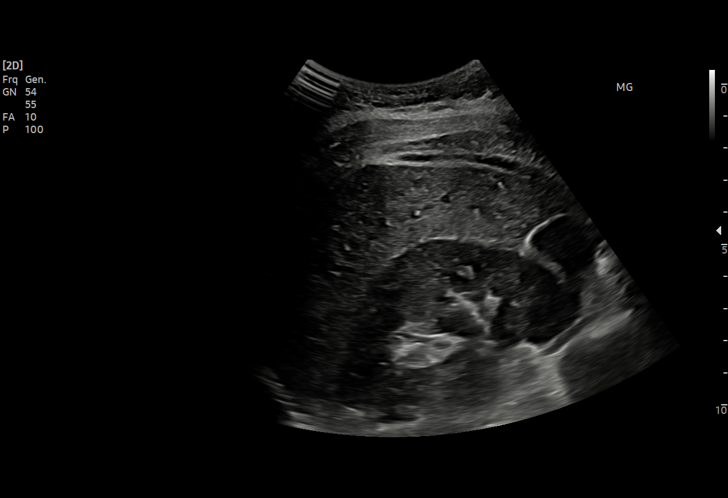
[im 61/78]
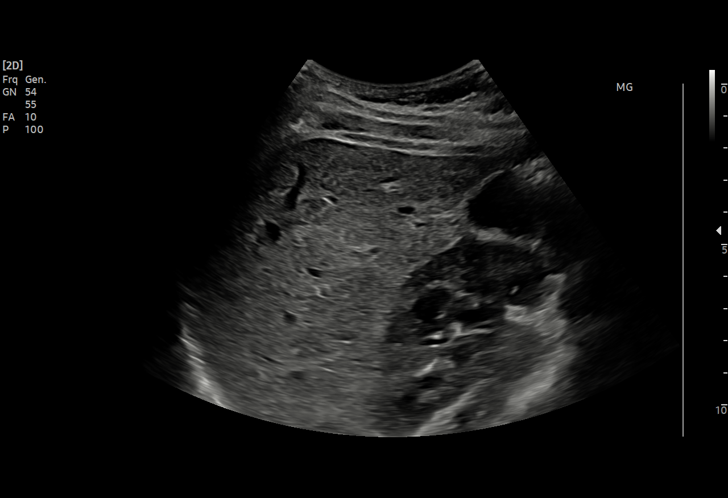
[im 65/78]
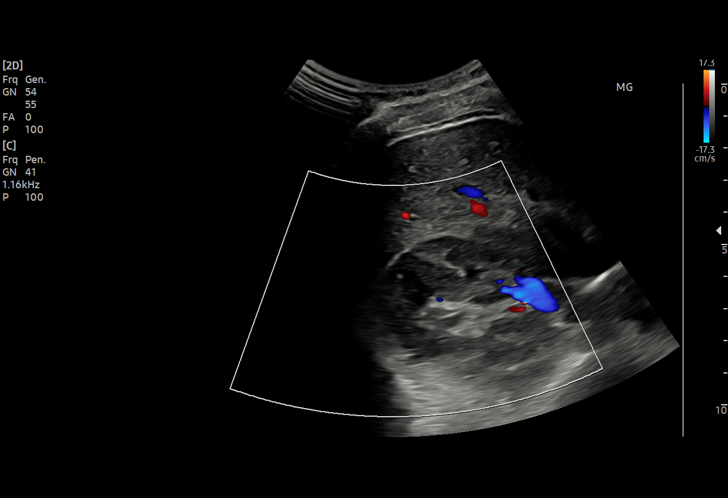
[im 71/78]
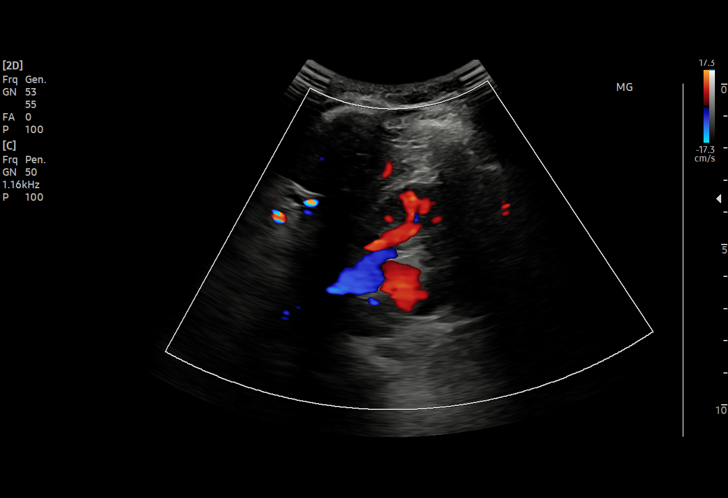
[im 78/78]
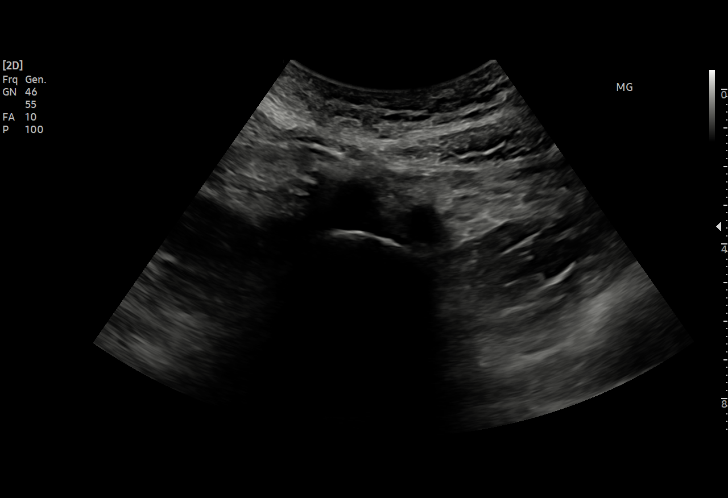

[15 of 25 positions shown; findings below may reference images not displayed]

FINDINGS: Gallbladder: No gallstones or wall thickening visualized. No
sonographic Murphy sign noted by sonographer.

Common bile duct: Diameter: 2 mm, normal.

Liver: No focal lesion identified. Within normal limits in
parenchymal echogenicity. Portal vein is patent on color Doppler
imaging with normal direction of blood flow towards the liver.

IVC: No abnormality visualized.

Pancreas: Visualized pancreas is within normal limits. No pancreatic
ductal dilatation.

Spleen: Size and appearance within normal limits.

Right Kidney: Length: 7.6 cm. Echogenicity within normal limits. No
mass or hydronephrosis visualized.

Left Kidney: Partially obscured by rib shadowing. Estimated length:
6.8 cm. Echogenicity within normal limits. No mass or hydronephrosis
visualized.

Abdominal aorta: No aneurysm visualized.

Other findings: No free fluid.
IMPRESSION: Negative ultrasound appearance of the abdomen.

## 2023-11-02 ENCOUNTER — Emergency Department (HOSPITAL_BASED_OUTPATIENT_CLINIC_OR_DEPARTMENT_OTHER)
Admission: EM | Admit: 2023-11-02 | Discharge: 2023-11-02 | Disposition: A | Discharge status: Home / Self Care | Attending: Emergency Medicine | Admitting: Emergency Medicine

## 2023-11-02 ENCOUNTER — Emergency Department (HOSPITAL_BASED_OUTPATIENT_CLINIC_OR_DEPARTMENT_OTHER)

## 2023-11-02 ENCOUNTER — Encounter (HOSPITAL_BASED_OUTPATIENT_CLINIC_OR_DEPARTMENT_OTHER): Payer: Self-pay | Admitting: Emergency Medicine

## 2023-11-02 ENCOUNTER — Other Ambulatory Visit: Payer: Self-pay

## 2023-11-02 DIAGNOSIS — R1030 Lower abdominal pain, unspecified: Secondary | ICD-10-CM | POA: Insufficient documentation

## 2023-11-02 DIAGNOSIS — D649 Anemia, unspecified: Secondary | ICD-10-CM | POA: Diagnosis not present

## 2023-11-02 DIAGNOSIS — Z91040 Latex allergy status: Secondary | ICD-10-CM | POA: Diagnosis not present

## 2023-11-02 DIAGNOSIS — N939 Abnormal uterine and vaginal bleeding, unspecified: Secondary | ICD-10-CM | POA: Diagnosis present

## 2023-11-02 LAB — CBC WITH DIFFERENTIAL/PLATELET
Abs Immature Granulocytes: 0.01 10*3/uL (ref 0.00–0.07)
Basophils Absolute: 0 10*3/uL (ref 0.0–0.1)
Basophils Relative: 1 %
Eosinophils Absolute: 0.1 10*3/uL (ref 0.0–0.5)
Eosinophils Relative: 2 %
HCT: 34.4 % — ABNORMAL LOW (ref 36.0–46.0)
Hemoglobin: 11.3 g/dL — ABNORMAL LOW (ref 12.0–15.0)
Immature Granulocytes: 0 %
Lymphocytes Relative: 46 %
Lymphs Abs: 2 10*3/uL (ref 0.7–4.0)
MCH: 30.5 pg (ref 26.0–34.0)
MCHC: 32.8 g/dL (ref 30.0–36.0)
MCV: 92.7 fL (ref 80.0–100.0)
Monocytes Absolute: 0.4 10*3/uL (ref 0.1–1.0)
Monocytes Relative: 8 %
Neutro Abs: 1.9 10*3/uL (ref 1.7–7.7)
Neutrophils Relative %: 43 %
Platelets: 232 10*3/uL (ref 150–400)
RBC: 3.71 MIL/uL — ABNORMAL LOW (ref 3.87–5.11)
RDW: 13.6 % (ref 11.5–15.5)
WBC: 4.4 10*3/uL (ref 4.0–10.5)
nRBC: 0 % (ref 0.0–0.2)

## 2023-11-02 LAB — COMPREHENSIVE METABOLIC PANEL WITH GFR
ALT: <5 U/L (ref 0–44)
AST: 13 U/L — ABNORMAL LOW (ref 15–41)
Albumin: 3.7 g/dL (ref 3.5–5.0)
Alkaline Phosphatase: 51 U/L (ref 38–126)
Anion gap: 10 (ref 5–15)
BUN: 6 mg/dL (ref 6–20)
CO2: 23 mmol/L (ref 22–32)
Calcium: 8.8 mg/dL — ABNORMAL LOW (ref 8.9–10.3)
Chloride: 108 mmol/L (ref 98–111)
Creatinine, Ser: 0.7 mg/dL (ref 0.44–1.00)
GFR, Estimated: >60 mL/min (ref >60)
Glucose, Bld: 86 mg/dL (ref 70–99)
Potassium: 4.1 mmol/L (ref 3.5–5.1)
Sodium: 141 mmol/L (ref 135–145)
Total Bilirubin: 0.4 mg/dL (ref 0.0–1.2)
Total Protein: 6.7 g/dL (ref 6.5–8.1)

## 2023-11-02 LAB — HCG, QUANTITATIVE, PREGNANCY: hCG, Beta Chain, Quant, S: 13 m[IU]/mL — ABNORMAL HIGH (ref <5)

## 2023-11-02 LAB — LIPASE, BLOOD: Lipase: 40 U/L (ref 11–51)

## 2023-11-02 LAB — HCG, SERUM, QUALITATIVE: Preg, Serum: NEGATIVE

## 2023-11-02 MED ORDER — MEDROXYPROGESTERONE ACETATE 5 MG PO TABS: 10.0000 mg | ORAL_TABLET | Freq: Three times a day (TID) | ORAL | 0 refills | Status: AC

## 2023-11-02 MED ORDER — MEDROXYPROGESTERONE ACETATE 10 MG PO TABS
20.0000 mg | ORAL_TABLET | Freq: Once | ORAL | Status: AC
  Administered 2023-11-02: 20 mg via ORAL
  Filled 2023-11-02: qty 2

## 2023-11-02 MED ORDER — IBUPROFEN 800 MG PO TABS
800.0000 mg | ORAL_TABLET | Freq: Once | ORAL | Status: AC
  Administered 2023-11-02: 800 mg via ORAL
  Filled 2023-11-02: qty 1

## 2023-11-02 NOTE — ED Provider Notes (Signed)
 [place] EMERGENCY DEPARTMENT AT [facility] Provider Note   CSN: [id number] Arrival date & time: 11/02/23  1137     Patient presents with: Vaginal Bleeding   Catherine Torres is a 38 y.o. female presents with concern for vaginal bleeding ongoing since 09/25/2023 when her miscarriage first started.  She was about 1 month pregnant at that time.  Reports ongoing bleeding and passage of clots.  Reports feeling some associated abdominal pain which is not well-controlled with naproxen  at home.  Denies any dizziness, fever, chills.  Has not been able to follow-up with her OB/GYN.    Vaginal Bleeding      Prior to Admission medications   Medication Sig Start Date End Date Taking? Authorizing Provider  medroxyPROGESTERone (PROVERA) 5 MG tablet Take 2 tablets (10 mg total) by mouth in the morning, at noon, and at bedtime for 7 days. 11/02/23 11/09/23 Yes Veta Palma, PA-C  albuterol (VENTOLIN HFA) 108 (90 Base) MCG/ACT inhaler Inhale 2 puffs into the lungs every 4 (four) hours as needed. 01/19/20   [provider]  Multiple Vitamin (MULTIVITAMIN WITH MINERALS) TABS tablet Take 1 tablet by mouth daily.    [provider]  naproxen  (NAPROSYN ) 500 MG tablet Take 1 tablet (500 mg total) by mouth 2 (two) times daily. 06/09/18   Theadore Ozell HERO, MD  ondansetron  (ZOFRAN ) 4 MG tablet Take 1 tablet (4 mg total) by mouth every 6 (six) hours. 10/01/20   White, Shelba SAUNDERS, NP  ondansetron  (ZOFRAN -ODT) 4 MG disintegrating tablet Take 1 tablet (4 mg total) by mouth every 8 (eight) hours as needed for nausea or vomiting. 06/12/21   Raspet, Erin K, PA-C  pantoprazole  (PROTONIX ) 40 MG tablet Take 1 tablet (40 mg total) by mouth daily. 06/16/22 06/16/23  Sridharan, Sriramkumar, MD  sucralfate  (CARAFATE ) 1 g tablet Take 1 tablet (1 g total) by mouth 4 (four) times daily -  with meals and at bedtime. 06/12/21   Raspet, Erin K, PA-C    Allergies: Latex and Penicillins    Review of Systems   Genitourinary:  Positive for vaginal bleeding.    Updated Vital Signs BP 111/85   Pulse 69   Temp 98.4 F (36.9 C)   Resp 18   LMP 09/25/2023   SpO2 100%   Physical Exam Vitals and nursing note reviewed.  Constitutional:      General: She is not in acute distress.    Appearance: She is well-developed.  HENT:     Head: Normocephalic and atraumatic.   Eyes:     Conjunctiva/sclera: Conjunctivae normal.    Cardiovascular:     Rate and Rhythm: Normal rate and regular rhythm.     Heart sounds: No murmur heard. Pulmonary:     Effort: Pulmonary effort is normal. No respiratory distress.     Breath sounds: Normal breath sounds.  Abdominal:     Palpations: Abdomen is soft.     Tenderness: There is abdominal tenderness.     Comments: Diffuse lower abdominal tenderness to palpation  Genitourinary:    Comments: Declines  Musculoskeletal:        General: No swelling.     Cervical back: Neck supple.   Skin:    General: Skin is warm and dry.     Capillary Refill: Capillary refill takes less than 2 seconds.   Neurological:     Mental Status: She is alert.   Psychiatric:        Mood and Affect: Mood normal.     (  all labs ordered are listed, but only abnormal results are displayed) Labs Reviewed  CBC WITH DIFFERENTIAL/PLATELET - Abnormal; Notable for the following components:      Result Value   RBC 3.71 (*)    Hemoglobin 11.3 (*)    HCT 34.4 (*)    All other components within normal limits  COMPREHENSIVE METABOLIC PANEL WITH GFR - Abnormal; Notable for the following components:   Calcium 8.8 (*)    AST 13 (*)    All other components within normal limits  HCG, QUANTITATIVE, PREGNANCY - Abnormal; Notable for the following components:   hCG, Beta Chain, Quant, S 13 (*)    All other components within normal limits  LIPASE, BLOOD  HCG, SERUM, QUALITATIVE    EKG: None  Radiology: US  PELVIC COMPLETE WITH TRANSVAGINAL Result Date: 11/02/2023 CLINICAL DATA:   Vaginal bleeding x3 weeks after cesarean section. EXAM: TRANSABDOMINAL ULTRASOUND OF PELVIS TECHNIQUE: Transabdominal ultrasound examination of the pelvis was performed. Transabdominal technique was performed for global imaging of the pelvis including uterus, ovaries, adnexal regions, and pelvic cul-de-sac. COMPARISON:  None Available. FINDINGS: Uterus Measurements: 10.7 cm x 5.1 cm x 6.8 cm = volume: 192.5 mL. No fibroids or other mass visualized. Endometrium Thickness: 9.5 mm. No focal abnormality visualized. No abnormal endometrial flow is seen on color Doppler evaluation. Right ovary Measurements: 4.1 cm x 2.2 cm x 1.9 cm = volume: 8.7 mL. A 3.1 cm x 1.8 cm x 2.7 cm simple right ovarian cyst is seen. Left ovary Measurements: 2.5 cm x 1.8 cm x 2.3 cm = volume: 5.2 mL. Normal appearance/no adnexal mass. Other findings No abnormal free fluid. IMPRESSION: 1. Mildly enlarged, postpartum uterus. 2. Large, simple right ovarian cyst. Electronically Signed   By: Suzen Dials M.D.   On: 11/02/2023 15:07     Procedures   Medications Ordered in the ED  ibuprofen (ADVIL) tablet 800 mg (800 mg Oral Given 11/02/23 1313)  medroxyPROGESTERone (PROVERA) tablet 20 mg (20 mg Oral Given 11/02/23 1612)                                    Medical Decision Making Amount and/or Complexity of Data Reviewed Labs: ordered. Radiology: ordered.  Risk Prescription drug management.     Differential diagnosis includes but is not limited to Incomplete abortion, abnormal uterine bleeding, blood loss anemia, septic abortion  ED Course:  Upon initial evaluation, patient is well-appearing, no acute distress.  Stable vitals.  Reporting some lower abdominal pain that is accompanied this  vaginal bleeding.  Lower abdomen is tender to palpation, no rebound or guarding.   Labs Ordered: I Ordered, and personally interpreted labs.  The pertinent results include:   CBC with hemoglobin at 11.3, does appear to be consistent  with baseline.  No leukocytosis CMP unremarkable Lipase within normal limits Qualitative hCG negative, but quantitative with a beta-hCG at 13  Imaging Studies ordered: I ordered imaging studies including transvaginal ultrasound I independently visualized the imaging with scope of interpretation limited to determining acute life threatening conditions related to emergency care. Imaging showed  Mildly enlarged, postpartum uterus.  Large simple right ovarian cyst.  No retained products of conception noted I agree with the radiologist interpretation   Medications Given: Provera  Upon re-evaluation, patient remains well-appearing with stable vitals.  Discussed that her ultrasound does not show any signs of retained products of conception.  No concern for incomplete abortion  at this time.  No fever, tachycardia, or leukocytosis to suggest systemic infection.  Will start her on a course of Provera to help with her vaginal bleeding.  No signs of acute blood loss anemia as hemoglobin mildly low at 11.3, but seems consistent with baseline.  No indication for transfusion.  No leukocytosis, no elevation in LFTs, creatinine, or lipase, I have lower concern for acute intra-abdominal pathology at this time that would require CT abdomen and pelvis or admission at this time.  Stable and appropriate for discharge home.  She states she has a OB/GYN, I recommended she follow-up within the next week for further management.    Impression: Abnormal uterine bleeding   Disposition:  The patient was discharged home with instructions to take course of Provera as prescribed for vaginal bleeding.  Follow-up with her OB/GYN within the next week for follow up of symptoms and recheck of hemoglobin. Return precautions given.     This chart was dictated using voice recognition software, Dragon. Despite the best efforts of this provider to proofread and correct errors, errors may still occur which can change documentation  meaning.       Final diagnoses:  Abnormal uterine bleeding, postpartum  Low hemoglobin    ED Discharge Orders          Ordered    medroxyPROGESTERone (PROVERA) 5 MG tablet  3 times daily        11/02/23 1556               Veta Palma, PA-C 11/02/23 1954    Mannie Pac T, DO 11/05/23 [phone]

## 2023-11-02 NOTE — ED Notes (Addendum)
 Checked on pt and family with pt (children) request for drink and snacks, provided 2 apple juice's and a granola bar.

## 2023-11-02 NOTE — ED Notes (Signed)
Reviewed discharge instructions, medications, and home care with pt. Pt verbalized understanding and had no further questions. Pt exited ED without complications.

## 2023-11-02 NOTE — ED Notes (Signed)
 Patient transported to Ultrasound

## 2023-11-02 NOTE — ED Triage Notes (Signed)
 Miscarriage a few weeks ago.  Vag bleeding, fatigue and lightheaded. cramping Changing pads 6 x a day  Has not seen ob/gyn since miscarriage  Started bleeding 09/25/23, LMP prior to may was 08/11/2023

## 2023-11-02 NOTE — Discharge Instructions (Addendum)
 Your ultrasound was reassuring and did not show any signs of retained products of conception in your uterus.  A cyst noted in your right ovary.  Otherwise the skin was unremarkable. I have included the results below for your reference.  You have been prescribed a medication called Provera to help stop the bleeding you are experiencing.  Please take this 3 times daily for the next 7 days as prescribed.  If you have any severe increase in your bleeding, please go directly to the women and children's unit at [facility] as they have OB/GYN's onsite who can help.  You do not need an appointment.  Your hemoglobin which is one of your blood counts was low today at 11.3.  Please follow-up with your OB/GYN within the next week for recheck of your symptoms, repeat hemoglobin, and for further management.  Return to the ER for any severe dizziness or weakness, shortness of breath, any other new or concerning symptoms.   EXAM:  TRANSABDOMINAL ULTRASOUND OF PELVIS    TECHNIQUE:  Transabdominal ultrasound examination of the pelvis was performed.  Transabdominal technique was performed for global imaging of the  pelvis including uterus, ovaries, adnexal regions, and pelvic  cul-de-sac.    COMPARISON:  None Available.    FINDINGS:  Uterus    Measurements: 10.7 cm x 5.1 cm x 6.8 cm = volume: 192.5 mL. No  fibroids or other mass visualized.    Endometrium    Thickness: 9.5 mm. No focal abnormality visualized. No abnormal  endometrial flow is seen on color Doppler evaluation.    Right ovary    Measurements: 4.1 cm x 2.2 cm x 1.9 cm = volume: 8.7 mL. A 3.1 cm x  1.8 cm x 2.7 cm simple right ovarian cyst is seen.    Left ovary    Measurements: 2.5 cm x 1.8 cm x 2.3 cm = volume: 5.2 mL. Normal  appearance/no adnexal mass.    Other findings    No abnormal free fluid.    IMPRESSION:  1. Mildly enlarged, postpartum uterus.  2. Large, simple right ovarian cyst.       Electronically Signed    By: Suzen Dials M.D.    On: 11/02/2023 15:07

## 2024-03-10 ENCOUNTER — Other Ambulatory Visit: Payer: Self-pay

## 2024-03-10 ENCOUNTER — Emergency Department (HOSPITAL_BASED_OUTPATIENT_CLINIC_OR_DEPARTMENT_OTHER)
Admission: EM | Admit: 2024-03-10 | Discharge: 2024-03-11 | Disposition: A | Discharge status: Home / Self Care | Attending: Emergency Medicine | Admitting: Emergency Medicine

## 2024-03-10 DIAGNOSIS — Z91040 Latex allergy status: Secondary | ICD-10-CM | POA: Diagnosis not present

## 2024-03-10 DIAGNOSIS — J029 Acute pharyngitis, unspecified: Secondary | ICD-10-CM | POA: Diagnosis present

## 2024-03-10 LAB — GROUP A STREP BY PCR: Group A Strep by PCR: NOT DETECTED

## 2024-03-10 MED ORDER — DEXAMETHASONE 10 MG/ML FOR PEDIATRIC ORAL USE
10.0000 mg | Freq: Once | INTRAMUSCULAR | Status: AC
  Administered 2024-03-10: 10 mg via ORAL

## 2024-03-10 NOTE — ED Notes (Signed)
 Reviewed discharge instructions and home care with pt. Pt verbalized understanding and had no further questions. Pt exited ED without complications.

## 2024-03-10 NOTE — Discharge Instructions (Addendum)
 Today you were seen for a sore throat, I suspect this is likely due to a virus.  You have been given a steroid while in the emergency department which should help with your sore throat.  You may also use salt water gargles and lozenges as needed.  Thank you for letting us  treat you today. After reviewing your labs, I feel you are safe to go home. Please follow up with your PCP in the next several days and provide them with your records from this visit. Return to the Emergency Room if pain becomes severe or symptoms worsen.

## 2024-03-10 NOTE — ED Provider Notes (Signed)
 [place] EMERGENCY DEPARTMENT AT [facility] Provider Note   CSN: [id number] Arrival date & time: 03/10/24  2139     Patient presents with: Sore Throat   Catherine Torres is a 38 y.o. female presents today for 3 days of a sore throat.  Patient also reports left ear pressure that started earlier today.  Patient denies fever, chills, nausea, vomiting, cough, congestion, rhinorrhea, sinus pressure, headache, shortness of breath, voice change or chest pain.    Sore Throat       Prior to Admission medications   Medication Sig Start Date End Date Taking? Authorizing Provider  albuterol (VENTOLIN HFA) 108 (90 Base) MCG/ACT inhaler Inhale 2 puffs into the lungs every 4 (four) hours as needed. 01/19/20   [provider]  medroxyPROGESTERone  (PROVERA ) 5 MG tablet Take 2 tablets (10 mg total) by mouth in the morning, at noon, and at bedtime for 7 days. 11/02/23 11/09/23  Franaszek, Amanda, PA-C  Multiple Vitamin (MULTIVITAMIN WITH MINERALS) TABS tablet Take 1 tablet by mouth daily.    [provider]  naproxen  (NAPROSYN ) 500 MG tablet Take 1 tablet (500 mg total) by mouth 2 (two) times daily. 06/09/18   Theadore Ozell HERO, MD  ondansetron  (ZOFRAN ) 4 MG tablet Take 1 tablet (4 mg total) by mouth every 6 (six) hours. 10/01/20   White, Shelba SAUNDERS, NP  ondansetron  (ZOFRAN -ODT) 4 MG disintegrating tablet Take 1 tablet (4 mg total) by mouth every 8 (eight) hours as needed for nausea or vomiting. 06/12/21   Raspet, Erin K, PA-C  pantoprazole  (PROTONIX ) 40 MG tablet Take 1 tablet (40 mg total) by mouth daily. 06/16/22 06/16/23  Sridharan, Sriramkumar, MD  sucralfate  (CARAFATE ) 1 g tablet Take 1 tablet (1 g total) by mouth 4 (four) times daily -  with meals and at bedtime. 06/12/21   Raspet, Erin K, PA-C    Allergies: Latex and Penicillins    Review of Systems  HENT:  Positive for ear pain and sore throat.     Updated Vital Signs BP 126/88   Pulse 65   Temp 98.9 F (37.2 C) (Oral)    Resp 16   LMP 03/10/2024 (Exact Date)   SpO2 100%   Physical Exam Vitals and nursing note reviewed.  Constitutional:      General: She is not in acute distress.    Appearance: She is well-developed. She is not toxic-appearing.  HENT:     Head: Normocephalic and atraumatic.     Right Ear: Tympanic membrane and ear canal normal.     Left Ear: Tympanic membrane and ear canal normal.     Nose: No congestion or rhinorrhea.     Mouth/Throat:     Mouth: Mucous membranes are moist.     Pharynx: Uvula midline. Posterior oropharyngeal erythema present. No pharyngeal swelling or oropharyngeal exudate.     Tonsils: No tonsillar exudate or tonsillar abscesses.     Comments: No sublingual or submandibular soft tissue swelling. Eyes:     Conjunctiva/sclera: Conjunctivae normal.  Cardiovascular:     Rate and Rhythm: Normal rate and regular rhythm.     Heart sounds: No murmur heard. Pulmonary:     Effort: Pulmonary effort is normal. No respiratory distress.     Breath sounds: Normal breath sounds.  Abdominal:     Palpations: Abdomen is soft.     Tenderness: There is no abdominal tenderness.  Musculoskeletal:        General: No swelling.     Cervical back: Neck  supple.  Skin:    General: Skin is warm and dry.     Capillary Refill: Capillary refill takes less than 2 seconds.  Neurological:     Mental Status: She is alert.  Psychiatric:        Mood and Affect: Mood normal.     (all labs ordered are listed, but only abnormal results are displayed) Labs Reviewed  GROUP A STREP BY PCR    EKG: None  Radiology: No results found.   Procedures   Medications Ordered in the ED  dexamethasone (DECADRON) 10 MG/ML injection for Pediatric ORAL use 10 mg (10 mg Oral Given 03/10/24 2318)                                    Medical Decision Making  This patient presents to the ED for concern of sore throat differential diagnosis includes strep pharyngitis, viral pharyngitis, PTA,  Ludwig's angina, RPA    Lab Tests:  I Ordered, and personally interpreted labs.  The pertinent results include: PCR negative   Medicines ordered and prescription drug management:  I ordered medication including Decadron    I have reviewed the patients home medicines and have made adjustments as needed   Problem List / ED Course:  Considered for admission or further workup however patient's vital signs, physical exam, and labs are reassuring.  Patient's symptoms likely due to viral pharyngitis.  Patient given symptomatic management.  Patient given return precautions.  I feel patient is safe for discharge at this time.     Final diagnoses:  Viral pharyngitis    ED Discharge Orders     None          Ladelle Teodoro N, PA-C 03/10/24 2332    Tegeler, Lonni PARAS, MD 03/16/24 [phone]

## 2024-03-10 NOTE — ED Triage Notes (Signed)
 Pt states that she has had sore throat x 3 days. She denies cough, congestion, fever, etc. She does also report left ear pressure that started today.
